# Patient Record
Sex: Female | Born: 1935 | Race: White | Hispanic: No | Smoking: Never smoker
Health system: Southern US, Community
[De-identification: ages and names within clinical notes are randomized; demographics above are authoritative.]

## PROBLEM LIST (undated history)

## (undated) DIAGNOSIS — T4145XA Adverse effect of unspecified anesthetic, initial encounter: Secondary | ICD-10-CM

## (undated) DIAGNOSIS — C50912 Malignant neoplasm of unspecified site of left female breast: Secondary | ICD-10-CM

## (undated) DIAGNOSIS — E46 Unspecified protein-calorie malnutrition: Secondary | ICD-10-CM

## (undated) DIAGNOSIS — R112 Nausea with vomiting, unspecified: Secondary | ICD-10-CM

## (undated) DIAGNOSIS — Z8719 Personal history of other diseases of the digestive system: Secondary | ICD-10-CM

## (undated) DIAGNOSIS — W19XXXA Unspecified fall, initial encounter: Secondary | ICD-10-CM

## (undated) DIAGNOSIS — T8859XA Other complications of anesthesia, initial encounter: Secondary | ICD-10-CM

## (undated) DIAGNOSIS — M1990 Unspecified osteoarthritis, unspecified site: Secondary | ICD-10-CM

## (undated) DIAGNOSIS — Z9889 Other specified postprocedural states: Secondary | ICD-10-CM

## (undated) HISTORY — PX: TONSILLECTOMY: SUR1361

## (undated) HISTORY — PX: ABDOMINAL HYSTERECTOMY: SHX81

## (undated) HISTORY — PX: APPENDECTOMY: SHX54

## (undated) HISTORY — PX: EYE SURGERY: SHX253

## (undated) HISTORY — PX: FRACTURE SURGERY: SHX138

## (undated) HISTORY — PX: CHOLECYSTECTOMY: SHX55

---

## 2000-09-01 ENCOUNTER — Inpatient Hospital Stay (HOSPITAL_COMMUNITY): Admission: EM | Admit: 2000-09-01 | Discharge: 2000-09-02 | Payer: Self-pay | Admitting: Emergency Medicine

## 2000-09-01 ENCOUNTER — Encounter: Payer: Self-pay | Admitting: Orthopedic Surgery

## 2000-09-01 ENCOUNTER — Encounter: Payer: Self-pay | Admitting: Emergency Medicine

## 2001-10-12 ENCOUNTER — Ambulatory Visit (HOSPITAL_COMMUNITY): Admission: RE | Admit: 2001-10-12 | Discharge: 2001-10-12 | Payer: Self-pay | Admitting: *Deleted

## 2004-11-08 ENCOUNTER — Emergency Department (HOSPITAL_COMMUNITY): Admission: EM | Admit: 2004-11-08 | Discharge: 2004-11-08 | Payer: Self-pay | Admitting: Emergency Medicine

## 2009-03-14 ENCOUNTER — Emergency Department (HOSPITAL_COMMUNITY): Admission: EM | Admit: 2009-03-14 | Discharge: 2009-03-14 | Payer: Self-pay | Admitting: Emergency Medicine

## 2010-01-27 ENCOUNTER — Encounter: Admission: RE | Admit: 2010-01-27 | Discharge: 2010-01-27 | Payer: Self-pay | Admitting: Internal Medicine

## 2010-11-13 NOTE — Op Note (Signed)
[facility]  Patient:    Brooke Perkins, Brooke Perkins                       MRN: [id number] Proc. Date: 09/01/00 Adm. Date:  [id number] Disc. Date: [id number] Attending:  Dominica Severin                           Operative Report  DATE OF BIRTH:  05-Nov-1916  PREOPERATIVE DIAGNOSIS:  Comminuted complex displaced closed right distal radius fracture with intra-articular extension.  POSTOPERATIVE DIAGNOSIS:  Comminuted complex displaced closed right distal radius fracture with intra-articular extension.  OPERATION: 1. Open reduction and internal fixation with allograft bone grafting, right    distal radius fracture utilizing K wires for fixations. 2. Application of an external fixator to the right wrist.  SURGEON:  Aron Baba, M.D.  ASSISTANT:  Della Goo, P.A.  COMPLICATIONS:  None.  ANESTHESIA:  General.  ESTIMATED BLOOD LOSS:  Minimal.  DRAINS:  One small TLS.  TOURNIQUET TIME:  Less than 1 hour.  INDICATIONS FOR THE PROCEDURE: This patient is an 77 year old white female who fell today and sustained the above-mentioned fracture.  She is highly independent, lives alone, and is very functional.  She has right-hand dominant posture to her upper extremity.  The patient notes significant pain and has been evaluated very carefully in the emergency room setting.  Given her age, comminution, activity level, hand dominance, and other issues, I felt she would be best served by stabilization.  She has a very displaced comminuted complex radius fracture.  All risks and benefits were discussed with her including risk of infection, bleeding, anesthesia, damage to normal structures, and failure of surgery to accomplish intended goals of relieving symptoms and restoring function.  OPERATIVE FINDINGS:  The patient had a comminuted complex distal radius fracture with some intra-articular extension. The patient had a large amount of osteopenia and  dorsal V defect.  The patient underwent fixation and bone grafting without difficulty.  I was very pleased with her stability as gauged under fluoroscopy and to my palpation and stress test and exam after fixation.  OPERATION IN DETAIL:  The patient was seen by myself in anesthesia preoperatively.  Risks and benefits were discussed.  She was then taken to the operating suite, placed supine.  General endotracheal anesthesia was induced, and she was then appropriately padded.  The right upper extremity was isolated and prepped and draped in usual sterile fashion with Betadine scrub followed by Betadine paint and draping.  Once this was done, the patient had the arm placed in traction.  I performed a manipulative reduction.  Following this, two K wires were placed without difficulty.  This achieved stabilization. Finger traps were hooked up, and the patient then underwent placement of an external fixator.  An incision was made about the distal third, middle third of the forearm.  Dissection was carried through skin with knife blade. Full-thickness flaps were made to ensure no loss of skin.  Care was taken to avoid superficial radial nerve interval between ECRL and ECRB was made, and the patient then had placement of two half pins, checked under radiograph. These were predrilled and then placed.  Once this was done, the area was irrigated and closed with interrupted Prolene suture.  Following this, two drains were placed distally through two small 1 cm incision.  This was made at the dorsoradial aspect of the second metacarpal.  The patient had guide for the half pins used to provide excellent splay.  Dissection was carried down through this small, 1 cm incisions with blunt dissection after knife blade over the skin.  Care was taken to avoid superficial nerve branches. Bicortical screw purchase was obtained with the half pins, and this was checked under fluoroscopy.  Once this was done, the  wounds were irrigated and closed with Prolene suture.  Following this, the patient had the external fixator apparatus assembled.  Once this was done, the patient had the external fixator tightened down with the wrist in some extension to maximize finger flexion postoperatively.  I was very happy with the overall alignment and length.  Care was taken to not overdistract the radial carpal joint.  This was noted on x-ray.  The K wires were in place.  Once this was done, the patient had access to the dorsal V defect.  This was done through the interval between the second and third dorsal compartments. Dissection was carried out through the skin longitudinally with knife. Following this, retractor was placed gently, and the patient had access to the fracture site dorsally obtained.  Once this was done, adjustments were made with the K wire for reduction purposes, and the patient had ORIF accomplished without difficulty.  I was happy with the radiographic parameters and overall look.  The patient had good stability about the distal radial ulnar joint at this time.  The patient then underwent packing of the dorsal V defect with large amounts of cancellous bone graft.  This was allograft bone.  This was discussed with the patient preoperatively.  She tolerated this well.  Once this was done, the allograft bone was tightly packed into place.  Final x-rays were made.  The half pins proximally and distally were in excellent position. The wrist was in excellent position.  The radial height, inclination, and volar tilt were restored to my satisfaction.  The dorsal V defect was filled with cancellous bone graft, and all looked well.  The distal radial ulnar joint was stable with smooth range of motion through pronation and supination.  Once this was done, tourniquet was deflated, hemostasis obtained.  Excellent refill was noted, and total tourniquet time was less than 40 minutes.  The patient then  underwent placement of Xeroform gauze over the wounds after cleansing the skin.  The K wires were clipped below the skin surface just off the radial styloid for later removal.  The  patient tolerated the procedure well without difficulty.  She had a sterile compressive dressing followed by volar plastic splint applied without difficulty after large amounts of gauze were placed.  A small #7 TLS drain was placed in the dorsal incision for the bone graft access. This will be removed tomorrow.  The patient tolerated the orthopedic portion of the procedure without difficulty.  All sponge, needle, and instrument counts were reported as correct.  There were no immediate operative complications.  The patient will be monitored closely postoperatively.  All questions have been encouraged and answered. DD:  09/01/00 TD:  09/02/00 Job: [id number]

## 2010-11-13 NOTE — Procedures (Signed)
[facility]  Patient:    Brooke Perkins, Brooke Perkins Visit Number: [id number] MRN: [id number]          Service Type: END Location: ENDO Attending Physician:  Sabino Gasser Dictated by:   Sabino Gasser, M.D. Proc. Date: 10/12/01 Admit Date:  10/12/2001                             Procedure Report  PROCEDURE:  Colonoscopy.  INDICATIONS FOR PROCEDURE:  Colon cancer screening.  ANESTHESIA:  Demerol 30, Versed 4 mg.  DESCRIPTION OF PROCEDURE:  With the patient mildly sedated in the left lateral decubitus position, the Olympus videoscopic pediatric colonoscope was inserted in the rectum, passed under direct vision through a very tortuous diverticular filled sigmoid colon to the cecum identified by the ileocecal valve and appendiceal orifice both of which were photographed. From this point, the colonoscope was slowly withdrawn taking circumferential views of the entire colonic mucosa stopping only to photograph the diverticula seen in the sigmoid colon until we reached the rectum which appeared normal in direct view and showed large hemorrhoids on retroflex and pull through the anal canal views. The endoscope was withdrawn. The patients vital signs and pulse oximeter remained stable. The patient tolerated the procedure well without apparent complications.  FINDINGS:  Significant diverticulosis of sigmoid colon with tortuosity and hemorrhoids otherwise an unremarkable examination.  PLAN:  Repeat examination as needed. Dictated by:   Sabino Gasser, M.D. Attending Physician:  Sabino Gasser DD:  10/12/01 TD:  10/12/01 Job: [phone]

## 2012-12-17 ENCOUNTER — Emergency Department (HOSPITAL_COMMUNITY): Payer: Medicare Other

## 2012-12-17 ENCOUNTER — Emergency Department (HOSPITAL_COMMUNITY)
Admission: EM | Admit: 2012-12-17 | Discharge: 2012-12-17 | Disposition: A | Discharge status: Home / Self Care | Payer: Medicare Other | Attending: Emergency Medicine | Admitting: Emergency Medicine

## 2012-12-17 ENCOUNTER — Encounter (HOSPITAL_COMMUNITY): Payer: Self-pay

## 2012-12-17 DIAGNOSIS — M129 Arthropathy, unspecified: Secondary | ICD-10-CM | POA: Insufficient documentation

## 2012-12-17 DIAGNOSIS — Z79899 Other long term (current) drug therapy: Secondary | ICD-10-CM | POA: Insufficient documentation

## 2012-12-17 DIAGNOSIS — R0602 Shortness of breath: Secondary | ICD-10-CM

## 2012-12-17 HISTORY — DX: Unspecified osteoarthritis, unspecified site: M19.90

## 2012-12-17 LAB — URINALYSIS, ROUTINE W REFLEX MICROSCOPIC
Glucose, UA: NEGATIVE mg/dL
Hgb urine dipstick: NEGATIVE
Protein, ur: NEGATIVE mg/dL
pH: 7 (ref 5.0–8.0)

## 2012-12-17 LAB — CBC
HCT: 41.2 % (ref 36.0–46.0)
MCH: 32.2 pg (ref 26.0–34.0)
MCV: 99.8 fL (ref 78.0–100.0)
Platelets: 137 10*3/uL — ABNORMAL LOW (ref 150–400)
RBC: 4.13 MIL/uL (ref 3.87–5.11)
RDW: 13.7 % (ref 11.5–15.5)
WBC: 4.8 10*3/uL (ref 4.0–10.5)

## 2012-12-17 LAB — BASIC METABOLIC PANEL
BUN: 14 mg/dL (ref 6–23)
CO2: 30 mEq/L (ref 19–32)
Calcium: 9.8 mg/dL (ref 8.4–10.5)
Chloride: 103 mEq/L (ref 96–112)
Creatinine, Ser: 0.53 mg/dL (ref 0.50–1.10)

## 2012-12-17 LAB — URINE MICROSCOPIC-ADD ON

## 2012-12-17 LAB — D-DIMER, QUANTITATIVE: D-Dimer, Quant: 0.64 ug/mL-FEU — ABNORMAL HIGH (ref 0.00–0.48)

## 2012-12-17 MED ORDER — IOHEXOL 350 MG/ML SOLN
100.0000 mL | Freq: Once | INTRAVENOUS | Status: AC | PRN
  Administered 2012-12-17: 100 mL via INTRAVENOUS

## 2012-12-17 MED ORDER — ALBUTEROL SULFATE HFA 108 (90 BASE) MCG/ACT IN AERS
2.0000 | INHALATION_SPRAY | RESPIRATORY_TRACT | Status: DC | PRN
  Administered 2012-12-17: 2 via RESPIRATORY_TRACT
  Filled 2012-12-17: qty 6.7

## 2012-12-17 NOTE — ED Provider Notes (Signed)
History     CSN: [id number]  Arrival date & time 12/17/12  1709   First MD Initiated Contact with Patient 12/17/12 1818      Chief Complaint  Patient presents with  . Shortness of Breath    (Consider location/radiation/quality/duration/timing/severity/associated sxs/prior treatment) HPI Pt presents with c/o shortness of breath.  Pt states she had onset of shortness of breath earlier this afternoon.  She states it has resolved now.  No chest pain.  No leg swelling.  No fever or cough.  She had family over to have lunch with her today and told them she was starting to feel short of breath, but family at bedside did not notice her to appear short of breath.  No sig medical hx other than arthritis.  Pt currently has no complaints.  There are no other associated systemic symptoms, there are no other alleviating or modifying factors.   Past Medical History  Diagnosis Date  . Arthritis     Past Surgical History  Procedure Laterality Date  . Abdominal hysterectomy    . Cholecystectomy    . Tonsillectomy    . Appendectomy      History reviewed. No pertinent family history.  History  Substance Use Topics  . Smoking status: Never Smoker   . Smokeless tobacco: Never Used  . Alcohol Use: No    OB History   Grav Para Term Preterm Abortions TAB SAB Ect Mult Living                  Review of Systems ROS reviewed and all otherwise negative except for mentioned in HPI  Allergies  Review of patient's allergies indicates no known allergies.  Home Medications   Current Outpatient Rx  Name  Route  Sig  Dispense  Refill  . cholecalciferol (VITAMIN D) 1000 UNITS tablet   Oral   Take 1,000 Units by mouth daily.         Marland Kitchen glucosamine-chondroitin 500-400 MG tablet   Oral   Take 1 tablet by mouth 3 (three) times daily.         Marland Kitchen HYDROcodone-acetaminophen (NORCO/VICODIN) 5-325 MG per tablet   Oral   Take 1 tablet by mouth every 6 (six) hours as needed for pain.         .  Multiple Vitamins-Minerals (MULTIVITAMIN WITH MINERALS) tablet   Oral   Take 1 tablet by mouth daily.         . vitamin B-12 (CYANOCOBALAMIN) 100 MCG tablet   Oral   Take 100 mcg by mouth daily.         . vitamin C (ASCORBIC ACID) 500 MG tablet   Oral   Take 500 mg by mouth daily.           BP 170/77  Pulse 84  Temp(Src) 98.6 F (37 C) (Oral)  Resp 17  Ht 5\' 4"  (1.626 m)  Wt 110 lb (49.896 kg)  BMI 18.87 kg/m2  SpO2 96% Vitals reviewed Physical Exam Physical Examination: General appearance - alert, well appearing, and in no distress Mental status - alert, oriented to person, place, and time Eyes - pupils equal and reactive, no conjunctival injection, no scleral icterus Mouth - mucous membranes moist, pharynx normal without lesions Chest - clear to auscultation, no wheezes, rales or rhonchi, symmetric air entry, no increased respiratory effort, patient speaking in full sentences  Heart - normal rate, regular rhythm, normal S1, S2, no murmurs, rubs, clicks or gallops Abdomen - soft, nontender, nondistended,  no masses or organomegaly Extremities - peripheral pulses normal, no pedal edema, no clubbing or cyanosis Skin - normal coloration and turgor, no rashes   ED Course  Procedures (including critical care time)   Date: 12/17/2012  Rate: 80  Rhythm: normal sinus rhythm  QRS Axis: normal  Intervals: normal  ST/T Wave abnormalities: nonspecific ST/T changes  Conduction Disutrbances:none  Narrative Interpretation:   Old EKG Reviewed: none available   Labs Reviewed  CBC - Abnormal; Notable for the following:    Platelets 137 (*)    All other components within normal limits  BASIC METABOLIC PANEL - Abnormal; Notable for the following:    Potassium 3.4 (*)    Glucose, Bld 102 (*)    GFR calc non Af Amer 79 (*)    All other components within normal limits  D-DIMER, QUANTITATIVE - Abnormal; Notable for the following:    D-Dimer, Quant 0.64 (*)    All other  components within normal limits  URINALYSIS, ROUTINE W REFLEX MICROSCOPIC - Abnormal; Notable for the following:    Leukocytes, UA MODERATE (*)    All other components within normal limits  PRO B NATRIURETIC PEPTIDE  TROPONIN I  URINE MICROSCOPIC-ADD ON   Dg Chest 2 View  12/17/2012   *RADIOLOGY REPORT*  Clinical Data: Short of breath and weakness  CHEST - 2 VIEW  Comparison: None  Findings: COPD with hyperinflation.  Negative for pneumonia. Negative for heart failure.  There is a tiny left effusion.  The aorta is uncoiled.  IMPRESSION: Tiny left effusion and COPD.  Negative for heart failure or pneumonia.   Original Report Authenticated By: Janeece Riggers, M.D.   Ct Angio Chest Pe W/cm &/or Wo Cm  12/17/2012   *RADIOLOGY REPORT*  Clinical Data: Shortness of breath, elevated D-dimer.  CT ANGIOGRAPHY CHEST  Technique:  Multidetector CT imaging of the chest using the standard protocol during bolus administration of intravenous contrast. Multiplanar reconstructed images including MIPs were obtained and reviewed to evaluate the vascular anatomy.  Contrast: OMNIPAQUE IOHEXOL 350 MG/ML SOLN  Comparison: 12/17/2012  Findings: Hyperinflation of the lungs compatible with COPD. Cardiomegaly.  Aorta is tortuous and ectatic, non-aneurysmal.  No filling defects in the pulmonary arteries to suggest pulmonary emboli.  Minimal scarring or atelectasis in the lung bases.  No pleural effusions.  No acute bony abnormality.  Imaging into the upper abdomen demonstrates intrahepatic biliary ductal dilatation.  The extrahepatic biliary system is not visualized on this chest CT.  IMPRESSION: No evidence of pulmonary embolus.  Hyperinflation/COPD.  Cardiomegaly.   Original Report Authenticated By: Charlett Nose, M.D.     1. Shortness of breath       MDM  Pt presenting with c/o shortness of breath which has now resolved.  Labs are reassuring except d-dimer being elevated, CT angio of chest was reassuring- does show  picture c/w COPD.  Pt has always been a nonsmoker- no hx of COPD- no wheezing on exam.  Given albuterol MDI.  Pt is asymptomatic at this time- low suspicion for CHF, ACS, PE, or any other acute emergent problem at this time.  She and family feel comfortable with plan for discharge.           Ethelda Chick, MD 12/19/12 [phone]

## 2012-12-17 NOTE — ED Notes (Signed)
Patient reports that she began having SOB about 3 hours. Patient denies CP.

## 2015-06-25 DIAGNOSIS — R5383 Other fatigue: Secondary | ICD-10-CM | POA: Diagnosis not present

## 2015-06-25 DIAGNOSIS — E559 Vitamin D deficiency, unspecified: Secondary | ICD-10-CM | POA: Diagnosis not present

## 2015-06-25 DIAGNOSIS — Z0000 Encounter for general adult medical examination without abnormal findings: Secondary | ICD-10-CM | POA: Diagnosis not present

## 2015-06-25 DIAGNOSIS — Z0001 Encounter for general adult medical examination with abnormal findings: Secondary | ICD-10-CM | POA: Diagnosis not present

## 2015-06-25 DIAGNOSIS — Z23 Encounter for immunization: Secondary | ICD-10-CM | POA: Diagnosis not present

## 2015-07-01 DIAGNOSIS — E559 Vitamin D deficiency, unspecified: Secondary | ICD-10-CM | POA: Diagnosis not present

## 2015-07-01 DIAGNOSIS — Z0000 Encounter for general adult medical examination without abnormal findings: Secondary | ICD-10-CM | POA: Diagnosis not present

## 2015-11-16 ENCOUNTER — Observation Stay (HOSPITAL_COMMUNITY)
Admission: EM | Admit: 2015-11-16 | Discharge: 2015-11-18 | Disposition: A | Discharge status: Skilled Nursing Facility | Payer: Medicare Other | Attending: Internal Medicine | Admitting: Internal Medicine

## 2015-11-16 ENCOUNTER — Emergency Department (HOSPITAL_COMMUNITY): Payer: Medicare Other

## 2015-11-16 ENCOUNTER — Encounter (HOSPITAL_COMMUNITY): Payer: Self-pay | Admitting: Emergency Medicine

## 2015-11-16 DIAGNOSIS — H9190 Unspecified hearing loss, unspecified ear: Secondary | ICD-10-CM | POA: Insufficient documentation

## 2015-11-16 DIAGNOSIS — W19XXXA Unspecified fall, initial encounter: Secondary | ICD-10-CM | POA: Diagnosis not present

## 2015-11-16 DIAGNOSIS — R030 Elevated blood-pressure reading, without diagnosis of hypertension: Secondary | ICD-10-CM | POA: Diagnosis not present

## 2015-11-16 DIAGNOSIS — E440 Moderate protein-calorie malnutrition: Secondary | ICD-10-CM | POA: Diagnosis not present

## 2015-11-16 DIAGNOSIS — Z681 Body mass index (BMI) 19 or less, adult: Secondary | ICD-10-CM | POA: Diagnosis not present

## 2015-11-16 DIAGNOSIS — S8002XA Contusion of left knee, initial encounter: Secondary | ICD-10-CM | POA: Diagnosis not present

## 2015-11-16 DIAGNOSIS — M25562 Pain in left knee: Secondary | ICD-10-CM | POA: Insufficient documentation

## 2015-11-16 DIAGNOSIS — R269 Unspecified abnormalities of gait and mobility: Secondary | ICD-10-CM | POA: Insufficient documentation

## 2015-11-16 DIAGNOSIS — Z9049 Acquired absence of other specified parts of digestive tract: Secondary | ICD-10-CM | POA: Diagnosis not present

## 2015-11-16 DIAGNOSIS — Z79899 Other long term (current) drug therapy: Secondary | ICD-10-CM | POA: Insufficient documentation

## 2015-11-16 DIAGNOSIS — Z90710 Acquired absence of both cervix and uterus: Secondary | ICD-10-CM | POA: Diagnosis not present

## 2015-11-16 DIAGNOSIS — T148 Other injury of unspecified body region: Secondary | ICD-10-CM | POA: Diagnosis not present

## 2015-11-16 DIAGNOSIS — S8001XA Contusion of right knee, initial encounter: Secondary | ICD-10-CM | POA: Diagnosis not present

## 2015-11-16 DIAGNOSIS — M1990 Unspecified osteoarthritis, unspecified site: Secondary | ICD-10-CM | POA: Diagnosis present

## 2015-11-16 DIAGNOSIS — S8991XA Unspecified injury of right lower leg, initial encounter: Secondary | ICD-10-CM | POA: Diagnosis not present

## 2015-11-16 DIAGNOSIS — S32040A Wedge compression fracture of fourth lumbar vertebra, initial encounter for closed fracture: Secondary | ICD-10-CM | POA: Insufficient documentation

## 2015-11-16 DIAGNOSIS — M533 Sacrococcygeal disorders, not elsewhere classified: Secondary | ICD-10-CM | POA: Insufficient documentation

## 2015-11-16 DIAGNOSIS — M25561 Pain in right knee: Secondary | ICD-10-CM | POA: Diagnosis not present

## 2015-11-16 DIAGNOSIS — N6320 Unspecified lump in the left breast, unspecified quadrant: Secondary | ICD-10-CM

## 2015-11-16 DIAGNOSIS — R262 Difficulty in walking, not elsewhere classified: Secondary | ICD-10-CM

## 2015-11-16 DIAGNOSIS — M7989 Other specified soft tissue disorders: Secondary | ICD-10-CM | POA: Diagnosis not present

## 2015-11-16 DIAGNOSIS — N63 Unspecified lump in breast: Secondary | ICD-10-CM | POA: Diagnosis not present

## 2015-11-16 DIAGNOSIS — M5136 Other intervertebral disc degeneration, lumbar region: Secondary | ICD-10-CM | POA: Diagnosis not present

## 2015-11-16 DIAGNOSIS — S3993XA Unspecified injury of pelvis, initial encounter: Secondary | ICD-10-CM | POA: Diagnosis not present

## 2015-11-16 DIAGNOSIS — N390 Urinary tract infection, site not specified: Principal | ICD-10-CM | POA: Insufficient documentation

## 2015-11-16 DIAGNOSIS — E876 Hypokalemia: Secondary | ICD-10-CM | POA: Diagnosis present

## 2015-11-16 DIAGNOSIS — IMO0001 Reserved for inherently not codable concepts without codable children: Secondary | ICD-10-CM | POA: Diagnosis present

## 2015-11-16 LAB — URINALYSIS, ROUTINE W REFLEX MICROSCOPIC
Glucose, UA: 100 mg/dL — AB
Hgb urine dipstick: NEGATIVE
Ketones, ur: NEGATIVE mg/dL
NITRITE: NEGATIVE
PROTEIN: 100 mg/dL — AB
SPECIFIC GRAVITY, URINE: 1.022 (ref 1.005–1.030)
pH: 6 (ref 5.0–8.0)

## 2015-11-16 LAB — COMPREHENSIVE METABOLIC PANEL
ALK PHOS: 52 U/L (ref 38–126)
ALT: 26 U/L (ref 14–54)
AST: 44 U/L — ABNORMAL HIGH (ref 15–41)
Albumin: 3.8 g/dL (ref 3.5–5.0)
Anion gap: 8 (ref 5–15)
BUN: 32 mg/dL — ABNORMAL HIGH (ref 6–20)
CALCIUM: 9.3 mg/dL (ref 8.9–10.3)
CO2: 29 mmol/L (ref 22–32)
CREATININE: 0.74 mg/dL (ref 0.44–1.00)
Chloride: 105 mmol/L (ref 101–111)
Glucose, Bld: 104 mg/dL — ABNORMAL HIGH (ref 65–99)
Potassium: 3.2 mmol/L — ABNORMAL LOW (ref 3.5–5.1)
Sodium: 142 mmol/L (ref 135–145)
TOTAL PROTEIN: 6.5 g/dL (ref 6.5–8.1)
Total Bilirubin: 1.2 mg/dL (ref 0.3–1.2)

## 2015-11-16 LAB — CBC WITH DIFFERENTIAL/PLATELET
BASOS PCT: 0 %
Basophils Absolute: 0 10*3/uL (ref 0.0–0.1)
EOS ABS: 0 10*3/uL (ref 0.0–0.7)
Eosinophils Relative: 0 %
HCT: 42.5 % (ref 36.0–46.0)
HEMOGLOBIN: 13.8 g/dL (ref 12.0–15.0)
Lymphocytes Relative: 8 %
Lymphs Abs: 0.7 10*3/uL (ref 0.7–4.0)
MCH: 32.5 pg (ref 26.0–34.0)
MCHC: 32.5 g/dL (ref 30.0–36.0)
MCV: 100.2 fL — ABNORMAL HIGH (ref 78.0–100.0)
Monocytes Absolute: 0.5 10*3/uL (ref 0.1–1.0)
Monocytes Relative: 6 %
NEUTROS PCT: 86 %
Neutro Abs: 7.9 10*3/uL — ABNORMAL HIGH (ref 1.7–7.7)
Platelets: 125 10*3/uL — ABNORMAL LOW (ref 150–400)
RBC: 4.24 MIL/uL (ref 3.87–5.11)
RDW: 14.3 % (ref 11.5–15.5)
WBC: 9.2 10*3/uL (ref 4.0–10.5)

## 2015-11-16 LAB — URINE MICROSCOPIC-ADD ON

## 2015-11-16 LAB — CK: CK TOTAL: 551 U/L — AB (ref 38–234)

## 2015-11-16 MED ORDER — TRAMADOL HCL 50 MG PO TABS
50.0000 mg | ORAL_TABLET | Freq: Once | ORAL | Status: AC
  Administered 2015-11-16: 50 mg via ORAL
  Filled 2015-11-16: qty 1

## 2015-11-16 MED ORDER — VITAMIN B-12 100 MCG PO TABS
100.0000 ug | ORAL_TABLET | Freq: Every day | ORAL | Status: DC
  Administered 2015-11-17 – 2015-11-18 (×3): 100 ug via ORAL
  Filled 2015-11-16 (×3): qty 1

## 2015-11-16 MED ORDER — MORPHINE SULFATE (PF) 2 MG/ML IV SOLN: 1.0000 mg | INTRAVENOUS | Status: DC | PRN

## 2015-11-16 MED ORDER — HYDRALAZINE HCL 20 MG/ML IJ SOLN: 2.0000 mg | INTRAMUSCULAR | Status: DC | PRN

## 2015-11-16 MED ORDER — ENSURE ENLIVE PO LIQD
237.0000 mL | Freq: Two times a day (BID) | ORAL | Status: DC
  Administered 2015-11-17 – 2015-11-18 (×3): 237 mL via ORAL

## 2015-11-16 MED ORDER — MORPHINE SULFATE (PF) 4 MG/ML IV SOLN
4.0000 mg | Freq: Once | INTRAVENOUS | Status: AC
  Administered 2015-11-16: 4 mg via INTRAVENOUS
  Filled 2015-11-16: qty 1

## 2015-11-16 MED ORDER — DEXTROSE 5 % IV SOLN
1.0000 g | INTRAVENOUS | Status: DC
  Administered 2015-11-16 – 2015-11-17 (×2): 1 g via INTRAVENOUS
  Filled 2015-11-16 (×2): qty 10

## 2015-11-16 MED ORDER — VITAMIN D 1000 UNITS PO TABS
1000.0000 [IU] | ORAL_TABLET | Freq: Every day | ORAL | Status: DC
  Administered 2015-11-17 – 2015-11-18 (×3): 1000 [IU] via ORAL
  Filled 2015-11-16 (×3): qty 1

## 2015-11-16 MED ORDER — ENOXAPARIN SODIUM 40 MG/0.4ML ~~LOC~~ SOLN
40.0000 mg | SUBCUTANEOUS | Status: DC
  Administered 2015-11-17: 40 mg via SUBCUTANEOUS
  Filled 2015-11-16: qty 0.4

## 2015-11-16 MED ORDER — ONDANSETRON HCL 4 MG/2ML IJ SOLN: 4.0000 mg | Freq: Three times a day (TID) | INTRAMUSCULAR | Status: DC | PRN

## 2015-11-16 MED ORDER — GLUCOSAMINE-CHONDROITIN 500-400 MG PO TABS: 1.0000 | ORAL_TABLET | Freq: Three times a day (TID) | ORAL | Status: DC

## 2015-11-16 MED ORDER — POTASSIUM CHLORIDE 20 MEQ/15ML (10%) PO SOLN
40.0000 meq | Freq: Once | ORAL | Status: AC
  Administered 2015-11-17: 40 meq via ORAL
  Filled 2015-11-16: qty 30

## 2015-11-16 MED ORDER — SODIUM CHLORIDE 0.9 % IV SOLN
Freq: Once | INTRAVENOUS | Status: AC
  Administered 2015-11-17: via INTRAVENOUS

## 2015-11-16 MED ORDER — ACETAMINOPHEN 325 MG PO TABS: 650.0000 mg | ORAL_TABLET | Freq: Three times a day (TID) | ORAL | Status: DC | PRN

## 2015-11-16 MED ORDER — SODIUM CHLORIDE 0.9 % IV SOLN
Freq: Once | INTRAVENOUS | Status: AC
  Administered 2015-11-16: 21:00:00 via INTRAVENOUS

## 2015-11-16 MED ORDER — VITAMIN C 500 MG PO TABS
500.0000 mg | ORAL_TABLET | Freq: Every day | ORAL | Status: DC
Start: 2015-11-16 — End: 2015-11-18
  Administered 2015-11-17 (×2): 500 mg via ORAL
  Filled 2015-11-16 (×3): qty 1

## 2015-11-16 MED ORDER — ONDANSETRON HCL 4 MG/2ML IJ SOLN
4.0000 mg | Freq: Once | INTRAMUSCULAR | Status: AC
  Administered 2015-11-16: 4 mg via INTRAVENOUS
  Filled 2015-11-16: qty 2

## 2015-11-16 MED ORDER — SODIUM CHLORIDE 0.9 % IV SOLN: Freq: Once | INTRAVENOUS | Status: DC

## 2015-11-16 MED ORDER — OXYCODONE-ACETAMINOPHEN 5-325 MG PO TABS
1.0000 | ORAL_TABLET | ORAL | Status: DC | PRN
  Administered 2015-11-17 – 2015-11-18 (×6): 1 via ORAL
  Filled 2015-11-16 (×6): qty 1

## 2015-11-16 NOTE — ED Provider Notes (Signed)
CSN: [id number]     Arrival date & time 11/16/15  1610 History   First MD Initiated Contact with Patient 11/16/15 1613     Chief Complaint  Patient presents with  . Fall   PT FELL LAST NIGHT AROUND 2300 AS SHE WAS TRYING TO OPEN A WINDOW.  THE PT WAS UNABLE TO GET UP OFF THE FLOOR LAST NIGHT AND WAS STUCK ON THE GROUND.  THE PT WAS ABLE TO GET TO HER PHONE THIS AM AND CALLED HER SON.  HER SON HELPED HER UP, BUT PT WAS UNABLE TO WALK.  PT C/O LOW BACK AND TAILBONE PAIN AND LEFT KNEE PAIN.  PT DID TAKE HER TRAMADOL THIS AM AND THAT HAS HELPED THE PAIN.  (Consider location/radiation/quality/duration/timing/severity/associated sxs/prior Treatment) Patient is a 82 y.o. female presenting with fall. The history is provided by the patient and the EMS personnel.  Fall This is a new problem. The current episode started 12 to 24 hours ago. The symptoms are aggravated by walking. The symptoms are relieved by lying down.    Past Medical History  Diagnosis Date  . Arthritis    Past Surgical History  Procedure Laterality Date  . Abdominal hysterectomy    . Cholecystectomy    . Tonsillectomy    . Appendectomy     No family history on file. Social History  Substance Use Topics  . Smoking status: Never Smoker   . Smokeless tobacco: Never Used  . Alcohol Use: No   OB History    No data available     Review of Systems  Musculoskeletal: Positive for back pain and gait problem.       LEFT KNEE PAIN  All other systems reviewed and are negative.     Allergies  Review of patient's allergies indicates no known allergies.  Home Medications   Prior to Admission medications   Medication Sig Start Date End Date Taking? Authorizing Provider  acetaminophen (TYLENOL) 650 MG CR tablet Take 650 mg by mouth every 8 (eight) hours as needed for pain.   Yes Historical Provider, MD  cholecalciferol (VITAMIN D) 1000 UNITS tablet Take 1,000 Units by mouth daily.   Yes Historical Provider, MD   glucosamine-chondroitin 500-400 MG tablet Take 1 tablet by mouth 3 (three) times daily.   Yes Historical Provider, MD  traMADol (ULTRAM) 50 MG tablet Take 50 mg by mouth 2 (two) times daily as needed for moderate pain or severe pain.  10/16/15  Yes Historical Provider, MD  vitamin B-12 (CYANOCOBALAMIN) 100 MCG tablet Take 100 mcg by mouth daily.   Yes Historical Provider, MD  vitamin C (ASCORBIC ACID) 500 MG tablet Take 500 mg by mouth daily.   Yes Historical Provider, MD   BP 186/89 mmHg  Pulse 88  Temp(Src) 97.6 F (36.4 C) (Oral)  Resp 16  Ht 5\' 4"  (1.626 m)  Wt 103 lb (46.72 kg)  BMI 17.67 kg/m2  SpO2 92% Physical Exam  Constitutional: She is oriented to person, place, and time. She appears well-developed and well-nourished.  HENT:  Head: Normocephalic and atraumatic.  Right Ear: External ear normal.  Left Ear: External ear normal.  Nose: Nose normal.  Mouth/Throat: Oropharynx is clear and moist.  Eyes: Conjunctivae and EOM are normal. Pupils are equal, round, and reactive to light.  Neck: Normal range of motion. Neck supple.  Cardiovascular: Normal rate, regular rhythm, normal heart sounds and intact distal pulses.   Pulmonary/Chest: Effort normal and breath sounds normal.  Abdominal: Soft. Bowel sounds  are normal.  Musculoskeletal:       Left knee: She exhibits swelling. Tenderness found.       Lumbar back: She exhibits tenderness.  Neurological: She is alert and oriented to person, place, and time.  Skin: Skin is warm and dry.  Psychiatric: She has a normal mood and affect. Her behavior is normal. Judgment and thought content normal.  Nursing note and vitals reviewed.   ED Course  Procedures (including critical care time) Labs Review Labs Reviewed  COMPREHENSIVE METABOLIC PANEL - Abnormal; Notable for the following:    Potassium 3.2 (*)    Glucose, Bld 104 (*)    BUN 32 (*)    AST 44 (*)    All other components within normal limits  CBC WITH DIFFERENTIAL/PLATELET  - Abnormal; Notable for the following:    MCV 100.2 (*)    Platelets 125 (*)    Neutro Abs 7.9 (*)    All other components within normal limits  URINALYSIS, ROUTINE W REFLEX MICROSCOPIC (NOT AT [facility]) - Abnormal; Notable for the following:    Color, Urine AMBER (*)    APPearance CLOUDY (*)    Glucose, UA 100 (*)    Bilirubin Urine SMALL (*)    Protein, ur 100 (*)    Leukocytes, UA MODERATE (*)    All other components within normal limits  CK - Abnormal; Notable for the following:    Total CK 551 (*)    All other components within normal limits  URINE MICROSCOPIC-ADD ON - Abnormal; Notable for the following:    Squamous Epithelial / LPF 0-5 (*)    Bacteria, UA FEW (*)    Casts HYALINE CASTS (*)    All other components within normal limits  MYOGLOBIN, SERUM    Imaging Review Dg Lumbar Spine Complete  11/16/2015  CLINICAL DATA:  82 year old female with lumbosacral back pain. EXAM: LUMBAR SPINE - COMPLETE 4+ VIEW COMPARISON:  Radiographs 01/20/2010 FINDINGS: Increased dextroscoliotic curvature of the lumbar spine. Progressive disc space narrowing and endplate spurring throughout most prominent at L1-L2. Progressive facet arthropathy at L4-L5 and L5-S1. Questionable superior endplate compression deformity of L4 versus positioning. Vertebral body heights are otherwise maintained. The bones are under mineralized. IMPRESSION: 1. Questionable mild superior endplate compression deformity of L4, age-indeterminate, versus positioning. 2. Progressive scoliosis, degenerative disc disease, and facet arthropathy since exam 6 years prior. Electronically Signed   By: Jeb Levering M.D.   On: 11/16/2015 21:12   Dg Pelvis 1-2 Views  11/16/2015  CLINICAL DATA:  Status post fall, with sacral pain. Initial encounter. EXAM: PELVIS - 1-2 VIEW COMPARISON:  Lumbar spine radiograph performed 03/14/2009 FINDINGS: There is no evidence of fracture or dislocation. Both femoral heads are seated normally within their  respective acetabula. Mild degenerative change is noted at the lower lumbar spine. The sacroiliac joints are unremarkable in appearance. The sacrum is not well assessed on this image. The visualized bowel gas pattern is grossly unremarkable in appearance. A moderate amount of stool is seen in the colon. IMPRESSION: No evidence of fracture or dislocation. Electronically Signed   By: Garald Balding M.D.   On: 11/16/2015 18:39   Dg Sacrum/coccyx  11/16/2015  CLINICAL DATA:  Status post fall, with sacral pain. Initial encounter. EXAM: SACRUM AND COCCYX - 2+ VIEW COMPARISON:  Lumbar spine radiographs performed 03/14/2009 FINDINGS: The sacrum and coccyx demonstrate grossly normal alignment, without definite evidence of fracture. Mild degenerative change is noted at the lower lumbar spine. The sacroiliac joints  demonstrate mild sclerosis. The visualized portions of the hips are grossly unremarkable. IMPRESSION: No evidence of fracture. Electronically Signed   By: Garald Balding M.D.   On: 11/16/2015 18:40   Dg Knee Complete 4 Views Left  11/16/2015  CLINICAL DATA:  Status post fall, with bilateral knee pain. Initial encounter. EXAM: LEFT KNEE - COMPLETE 4+ VIEW COMPARISON:  None. FINDINGS: There is no evidence of fracture or dislocation. Mild chondrocalcinosis is noted. There is diffuse osteopenia of visualized osseous structures. The joint spaces are preserved. No significant degenerative change is seen; the patellofemoral joint is grossly unremarkable in appearance. Trace knee joint fluid remains within normal limits. Soft tissue swelling is noted superior to the patella. IMPRESSION: 1. No evidence of fracture or dislocation. 2. Diffuse osteopenia of visualized osseous structures. 3. Mild chondrocalcinosis noted. 4. Soft tissue swelling superior to the patella. Electronically Signed   By: Garald Balding M.D.   On: 11/16/2015 18:38   Dg Knee Complete 4 Views Right  11/16/2015  CLINICAL DATA:  Status post fall,  with bilateral knee pain. Initial encounter. EXAM: RIGHT KNEE - COMPLETE 4+ VIEW COMPARISON:  None. FINDINGS: There is no evidence of fracture or dislocation. Marginal osteophyte formation is noted at the lateral compartment, and tibial spine osteophytes are seen. The joint spaces are preserved. No significant joint effusion is seen. The visualized soft tissues are normal in appearance. IMPRESSION: 1. No evidence of fracture or dislocation. 2. Mild osteoarthritis noted at the right knee. Electronically Signed   By: Garald Balding M.D.   On: 11/16/2015 18:41   I have personally reviewed and evaluated these images and lab results as part of my medical decision-making.   EKG Interpretation None      MDM  PT ATTEMPTED TO AMBULATE, BUT SHE WAS UNABLE TO DO SO.  SHE SAID IT WAS TOO PAINFUL.  THE PT WILL BE D/W HOSPITALISTS FOR ADMISSION.  DR. Blaine Hamper WILL ADMIT PT FOR OBS. Final diagnoses:  Ambulatory dysfunction  Inability to walk  Compression fracture of L4 lumbar vertebra, closed, initial encounter (HCC)        Isla Pence, MD 11/17/15 0001

## 2015-11-16 NOTE — H&P (Signed)
History and Physical    Brooke Perkins [id number] DOB: Mar 20, 1917 DOA: 11/16/2015  Referring MD/NP/PA:   PCP: No primary care provider on file.   Patient coming from:  The patient is coming from home.  At baseline, she is independent for most of her ADL.  She lives alone at home.  Chief Complaint: Pain over sacral area and bilateral knees after fall, burning on urination, left breast mass  HPI: Brooke Perkins is a 82 y.o. female with medical history significant of heard hearing, arthritis, who presents with pain over sacral area and bilateral knees after fall, burning on urination and left breast mass.  Pt reports that she fell when she was trying to open a window at about 11 PM last night. She did not have prodromal symptoms. No LOC or head injury. She developed pain over bilateral knees, and sacral area. She was unable to get up after fall last night and was stuck on the ground. She was able to get up to a phone this morning and called her son. Her son helped her up and found that patient was unable to walk due to severe pain over tailbone and the bilateral knees. Pt has bruise over right elbow, but no significant pain over that area. Patient states that she has mild burning on urination recently, but no dysuria or increased urinary frequency. She does not have chest pain, shortness breath, cough, nausea, vomiting, abdominal pain, diarrhea, unilateral weakness. Pt states that she noted a mass in left breast for several months, did not seek for treatment.  ED Course: pt was found to have positive urinalysis with moderate amount of leukocyte, temperature normal, no tachycardia, no tachypnea, potassium 3.2, creatinine normal, WBC 9.2. X-ray of bilateral knees, pelvis and sacrum/coccyx are negative. X-ray of lumbar spin showed qeuestionable mild superior endplate compression deformity of L4, age-indeterminate, versus positioning, progressive scoliosis, degenerative disc disease, and facet  arthropathy since exam 6 years prior. Pt is placed on med-surg bed for obs.  Review of Systems:   General: no fevers, chills, no changes in body weight, has fatigue HEENT: no blurry vision, hearing changes or sore throat Pulm: no dyspnea, coughing, wheezing CV: no chest pain, no palpitations Abd: no nausea, vomiting, abdominal pain, diarrhea, constipation GU: no dysuria, has burning on urination, no increased urinary frequency, hematuria  Ext: no leg edema Neuro: no unilateral weakness, numbness, or tingling, no vision change or hearing loss Skin: no rash. Has bruise over right elbow MSK: has pain over bilateral knees, sacral area Heme: No easy bruising.  Travel history: No recent long distant travel.  Allergy: No Known Allergies  Past Medical History  Diagnosis Date  . Arthritis     Past Surgical History  Procedure Laterality Date  . Abdominal hysterectomy    . Cholecystectomy    . Tonsillectomy    . Appendectomy      Social History:  reports that she has never smoked. She has never used smokeless tobacco. She reports that she does not drink alcohol or use illicit drugs.  Family History:  Family History  Problem Relation Age of Onset  . Arthritis/Rheumatoid Mother      Prior to Admission medications   Medication Sig Start Date End Date Taking? Authorizing Provider  acetaminophen (TYLENOL) 650 MG CR tablet Take 650 mg by mouth every 8 (eight) hours as needed for pain.   Yes Historical Provider, MD  cholecalciferol (VITAMIN D) 1000 UNITS tablet Take 1,000 Units by mouth daily.   Yes Historical Provider,  MD  glucosamine-chondroitin 500-400 MG tablet Take 1 tablet by mouth 3 (three) times daily.   Yes Historical Provider, MD  traMADol (ULTRAM) 50 MG tablet Take 50 mg by mouth 2 (two) times daily as needed for moderate pain or severe pain.  10/16/15  Yes Historical Provider, MD  vitamin B-12 (CYANOCOBALAMIN) 100 MCG tablet Take 100 mcg by mouth daily.   Yes Historical  Provider, MD  vitamin C (ASCORBIC ACID) 500 MG tablet Take 500 mg by mouth daily.   Yes Historical Provider, MD    Physical Exam: Filed Vitals:   11/16/15 2321 11/16/15 2356 11/17/15 0005 11/17/15 0532  BP: 176/88  148/75 169/80  Pulse:   89 81  Temp:   98.2 F (36.8 C) 98.2 F (36.8 C)  TempSrc:   Oral Oral  Resp: 19  16 18   Height:  5\' 4"  (1.626 m)    Weight:  44.5 kg (98 lb 1.7 oz)    SpO2:   90% 92%   General: Not in acute distress HEENT:       Eyes: PERRL, EOMI, no scleral icterus.       ENT: No discharge from the ears and nose, no pharynx injection, no tonsillar enlargement.        Neck: No JVD, no bruit, no mass felt. Heme: No neck lymph node enlargement. Cardiac: S1/S2, RRR, No murmurs, No gallops or rubs. Pulm: No rales, wheezing, rhonchi or rubs. Breast: pt has large mass in left breast, located in the central of breast with skin erosion. The mass is about 6 x 8 cm in size, hard, nonmoveble. Abd: Soft, nondistended, nontender, no rebound pain, no organomegaly, BS present. GU: No hematuria Ext: No pitting leg edema bilaterally. 2+DP/PT pulse bilaterally. Musculoskeletal: has tenderness over both knees (L>R) without swelling or redness. Has tenderness over sacral area. Skin: No rashes. Bruise over right elbow. Neuro: Alert, oriented X3, cranial nerves II-XII grossly intact, moves all extremities normally. muscle strength 5/5 in all extremities, sensation to light touch intact.  Knee reflex 1+ bilaterally. Negative Babinski's sign. Normal finger to nose test. Psych: Patient is not psychotic, no suicidal or hemocidal ideation.  Labs on Admission: I have personally reviewed following labs and imaging studies  CBC:  Recent Labs Lab 11/16/15 1659  WBC 9.2  NEUTROABS 7.9*  HGB 13.8  HCT 42.5  MCV 100.2*  PLT 0000000*   Basic Metabolic Panel:  Recent Labs Lab 11/16/15 1659 11/17/15 0418  NA 142  --   K 3.2*  --   CL 105  --   CO2 29  --   GLUCOSE 104*  --     BUN 32*  --   CREATININE 0.74  --   CALCIUM 9.3  --   MG  --  1.9   GFR: Estimated Creatinine Clearance: 28.9 mL/min (by C-G formula based on Cr of 0.74). Liver Function Tests:  Recent Labs Lab 11/16/15 1659  AST 44*  ALT 26  ALKPHOS 52  BILITOT 1.2  PROT 6.5  ALBUMIN 3.8   No results for input(s): LIPASE, AMYLASE in the last 168 hours. No results for input(s): AMMONIA in the last 168 hours. Coagulation Profile: No results for input(s): INR, PROTIME in the last 168 hours. Cardiac Enzymes:  Recent Labs Lab 11/16/15 1659  CKTOTAL 551*   BNP (last 3 results) No results for input(s): PROBNP in the last 8760 hours. HbA1C: No results for input(s): HGBA1C in the last 72 hours. CBG: No results for input(s): GLUCAP  in the last 168 hours. Lipid Profile: No results for input(s): CHOL, HDL, LDLCALC, TRIG, CHOLHDL, LDLDIRECT in the last 72 hours. Thyroid Function Tests: No results for input(s): TSH, T4TOTAL, FREET4, T3FREE, THYROIDAB in the last 72 hours. Anemia Panel: No results for input(s): VITAMINB12, FOLATE, FERRITIN, TIBC, IRON, RETICCTPCT in the last 72 hours. Urine analysis:    Component Value Date/Time   COLORURINE AMBER* 11/16/2015 1623   APPEARANCEUR CLOUDY* 11/16/2015 1623   LABSPEC 1.022 11/16/2015 1623   PHURINE 6.0 11/16/2015 1623   GLUCOSEU 100* 11/16/2015 1623   HGBUR NEGATIVE 11/16/2015 1623   BILIRUBINUR SMALL* 11/16/2015 1623   KETONESUR NEGATIVE 11/16/2015 1623   PROTEINUR 100* 11/16/2015 1623   UROBILINOGEN 0.2 12/17/2012 1926   NITRITE NEGATIVE 11/16/2015 1623   LEUKOCYTESUR MODERATE* 11/16/2015 1623   Sepsis Labs: @LABRCNTIP (procalcitonin:4,lacticidven:4) )No results found for this or any previous visit (from the past 240 hour(s)).   Radiological Exams on Admission: Dg Lumbar Spine Complete  11/16/2015  CLINICAL DATA:  82 year old female with lumbosacral back pain. EXAM: LUMBAR SPINE - COMPLETE 4+ VIEW COMPARISON:  Radiographs 01/20/2010  FINDINGS: Increased dextroscoliotic curvature of the lumbar spine. Progressive disc space narrowing and endplate spurring throughout most prominent at L1-L2. Progressive facet arthropathy at L4-L5 and L5-S1. Questionable superior endplate compression deformity of L4 versus positioning. Vertebral body heights are otherwise maintained. The bones are under mineralized. IMPRESSION: 1. Questionable mild superior endplate compression deformity of L4, age-indeterminate, versus positioning. 2. Progressive scoliosis, degenerative disc disease, and facet arthropathy since exam 6 years prior. Electronically Signed   By: Jeb Levering M.D.   On: 11/16/2015 21:12   Dg Pelvis 1-2 Views  11/16/2015  CLINICAL DATA:  Status post fall, with sacral pain. Initial encounter. EXAM: PELVIS - 1-2 VIEW COMPARISON:  Lumbar spine radiograph performed 03/14/2009 FINDINGS: There is no evidence of fracture or dislocation. Both femoral heads are seated normally within their respective acetabula. Mild degenerative change is noted at the lower lumbar spine. The sacroiliac joints are unremarkable in appearance. The sacrum is not well assessed on this image. The visualized bowel gas pattern is grossly unremarkable in appearance. A moderate amount of stool is seen in the colon. IMPRESSION: No evidence of fracture or dislocation. Electronically Signed   By: Garald Balding M.D.   On: 11/16/2015 18:39   Dg Sacrum/coccyx  11/16/2015  CLINICAL DATA:  Status post fall, with sacral pain. Initial encounter. EXAM: SACRUM AND COCCYX - 2+ VIEW COMPARISON:  Lumbar spine radiographs performed 03/14/2009 FINDINGS: The sacrum and coccyx demonstrate grossly normal alignment, without definite evidence of fracture. Mild degenerative change is noted at the lower lumbar spine. The sacroiliac joints demonstrate mild sclerosis. The visualized portions of the hips are grossly unremarkable. IMPRESSION: No evidence of fracture. Electronically Signed   By: Garald Balding M.D.   On: 11/16/2015 18:40   Dg Knee Complete 4 Views Left  11/16/2015  CLINICAL DATA:  Status post fall, with bilateral knee pain. Initial encounter. EXAM: LEFT KNEE - COMPLETE 4+ VIEW COMPARISON:  None. FINDINGS: There is no evidence of fracture or dislocation. Mild chondrocalcinosis is noted. There is diffuse osteopenia of visualized osseous structures. The joint spaces are preserved. No significant degenerative change is seen; the patellofemoral joint is grossly unremarkable in appearance. Trace knee joint fluid remains within normal limits. Soft tissue swelling is noted superior to the patella. IMPRESSION: 1. No evidence of fracture or dislocation. 2. Diffuse osteopenia of visualized osseous structures. 3. Mild chondrocalcinosis noted. 4. Soft tissue swelling superior  to the patella. Electronically Signed   By: Garald Balding M.D.   On: 11/16/2015 18:38   Dg Knee Complete 4 Views Right  11/16/2015  CLINICAL DATA:  Status post fall, with bilateral knee pain. Initial encounter. EXAM: RIGHT KNEE - COMPLETE 4+ VIEW COMPARISON:  None. FINDINGS: There is no evidence of fracture or dislocation. Marginal osteophyte formation is noted at the lateral compartment, and tibial spine osteophytes are seen. The joint spaces are preserved. No significant joint effusion is seen. The visualized soft tissues are normal in appearance. IMPRESSION: 1. No evidence of fracture or dislocation. 2. Mild osteoarthritis noted at the right knee. Electronically Signed   By: Garald Balding M.D.   On: 11/16/2015 18:41     EKG: Not done in ED, will get one.   Assessment/Plan Principal Problem:   UTI (lower urinary tract infection) Active Problems:   Arthritis   Fall   Hypokalemia   Elevated blood pressure   Protein-calorie malnutrition, moderate (HCC)   UTI (lower urinary tract infection): Patient has mild burning on urination, plus positive urinalysis, indicating UTI. Patient is nonseptic. -Please patient on  med-Surg bed for observation -IV rocephin -f/u Bx and Ux  Fall: X-ray of bilateral knees, pelvis and sacrum/coccyx are negative. X-ray of lumbar spin showed qeuestionable mild superior endplate compression deformity of L4, age-indeterminate. -Pain control: When necessary Percocet -PT/OT -consult to SW and CM  Hypokalemia: K=3.2  on admission. - Repleted - Check Mg level  Elevated blood pressure: Blood pressure 186/89. No history of hypertension. Likely due to pain. -When necessary hydralazine -Pain control as above  Protein-calorie malnutrition, moderate ([place]): -Ensure  Left breast mass: very concerning for breast cancer.  -Please call oncology in morning   DVT ppx: Q Lovenox Code Status: Full code Family Communication: None at bed side. Disposition Plan:  Anticipate discharge back to previous home environment Consults called:  none Admission status: medical floor/obs  Date of Service 11/17/2015    Ivor Costa [facility] Pager [phone]  If 7PM-7AM, please contact night-coverage [email or web address] Password [facility] 11/17/2015, 6:28 AM

## 2015-11-16 NOTE — ED Notes (Addendum)
Pt here via EMS following a fall at 2300 last night while she was trying to open a window. Pt has sacral pain and bilateral knee pain. Pt has a history of arthritis in her knees. Pt uses a walker to walk at home, but states she is only able to stand, not ambulate at this time. Pt called her son this morning who called EMS to her house.  Pt has a small abrasion on her right elbow and has small green spots on her inner left thigh and her left foot

## 2015-11-16 NOTE — Progress Notes (Signed)
PHARMACIST - PHYSICIAN ORDER COMMUNICATION ° °CONCERNING: P&T Medication Policy on Herbal Medications ° °DESCRIPTION:  This patient’s order for:  Glucosamine-Chondroitin  has been noted. ° °This product(s) is classified as an “herbal” or natural product. °Due to a lack of definitive safety studies or FDA approval, nonstandard manufacturing practices, plus the potential risk of unknown drug-drug interactions while on inpatient medications, the Pharmacy and Therapeutics Committee does not permit the use of “herbal” or natural products of this type within [place]. °  °ACTION TAKEN: °The pharmacy department is unable to verify this order at this time and your patient has been informed of this safety policy. °Please reevaluate patient’s clinical condition at discharge and address if the herbal or natural product(s) should be resumed at that time.  ° °Guila Owensby, PharmD °

## 2015-11-16 NOTE — ED Notes (Signed)
Pt has a reddened area next to her left areola. This was noticed while getting pt cleaned up and ready to go upstairs

## 2015-11-17 DIAGNOSIS — E440 Moderate protein-calorie malnutrition: Secondary | ICD-10-CM

## 2015-11-17 DIAGNOSIS — S32040A Wedge compression fracture of fourth lumbar vertebra, initial encounter for closed fracture: Secondary | ICD-10-CM | POA: Diagnosis not present

## 2015-11-17 DIAGNOSIS — N390 Urinary tract infection, site not specified: Secondary | ICD-10-CM | POA: Diagnosis not present

## 2015-11-17 DIAGNOSIS — W19XXXD Unspecified fall, subsequent encounter: Secondary | ICD-10-CM

## 2015-11-17 DIAGNOSIS — M1990 Unspecified osteoarthritis, unspecified site: Secondary | ICD-10-CM

## 2015-11-17 DIAGNOSIS — R030 Elevated blood-pressure reading, without diagnosis of hypertension: Secondary | ICD-10-CM

## 2015-11-17 DIAGNOSIS — E876 Hypokalemia: Secondary | ICD-10-CM

## 2015-11-17 DIAGNOSIS — N6320 Unspecified lump in the left breast, unspecified quadrant: Secondary | ICD-10-CM | POA: Diagnosis present

## 2015-11-17 DIAGNOSIS — N63 Unspecified lump in breast: Secondary | ICD-10-CM | POA: Diagnosis not present

## 2015-11-17 LAB — CBC WITH DIFFERENTIAL/PLATELET
BASOS ABS: 0 10*3/uL (ref 0.0–0.1)
BASOS PCT: 0 %
EOS PCT: 1 %
Eosinophils Absolute: 0 10*3/uL (ref 0.0–0.7)
HEMATOCRIT: 41 % (ref 36.0–46.0)
Hemoglobin: 13.7 g/dL (ref 12.0–15.0)
Lymphocytes Relative: 11 %
Lymphs Abs: 0.8 10*3/uL (ref 0.7–4.0)
MCH: 33.2 pg (ref 26.0–34.0)
MCHC: 33.4 g/dL (ref 30.0–36.0)
MCV: 99.3 fL (ref 78.0–100.0)
MONO ABS: 0.5 10*3/uL (ref 0.1–1.0)
Monocytes Relative: 6 %
NEUTROS ABS: 6.1 10*3/uL (ref 1.7–7.7)
Neutrophils Relative %: 82 %
PLATELETS: 113 10*3/uL — AB (ref 150–400)
RBC: 4.13 MIL/uL (ref 3.87–5.11)
RDW: 14.2 % (ref 11.5–15.5)
WBC: 7.5 10*3/uL (ref 4.0–10.5)

## 2015-11-17 LAB — BASIC METABOLIC PANEL
ANION GAP: 4 — AB (ref 5–15)
BUN: 23 mg/dL — ABNORMAL HIGH (ref 6–20)
CALCIUM: 8.5 mg/dL — AB (ref 8.9–10.3)
CO2: 28 mmol/L (ref 22–32)
Chloride: 109 mmol/L (ref 101–111)
Creatinine, Ser: 0.52 mg/dL (ref 0.44–1.00)
GLUCOSE: 102 mg/dL — AB (ref 65–99)
Potassium: 3.6 mmol/L (ref 3.5–5.1)
SODIUM: 141 mmol/L (ref 135–145)

## 2015-11-17 LAB — MYOGLOBIN, SERUM: Myoglobin: 452 ng/mL — ABNORMAL HIGH (ref 25–58)

## 2015-11-17 LAB — MAGNESIUM: MAGNESIUM: 1.9 mg/dL (ref 1.7–2.4)

## 2015-11-17 MED ORDER — ENOXAPARIN SODIUM 30 MG/0.3ML ~~LOC~~ SOLN
30.0000 mg | SUBCUTANEOUS | Status: DC
  Administered 2015-11-17: 30 mg via SUBCUTANEOUS
  Filled 2015-11-17: qty 0.3

## 2015-11-17 MED ORDER — SODIUM CHLORIDE 0.9 % IV SOLN
INTRAVENOUS | Status: DC
  Administered 2015-11-17 – 2015-11-18 (×2): via INTRAVENOUS

## 2015-11-17 MED ORDER — SODIUM CHLORIDE 0.9 % IV SOLN
INTRAVENOUS | Status: DC
  Filled 2015-11-17: qty 1000

## 2015-11-17 MED ORDER — ACETAMINOPHEN 500 MG PO TABS
500.0000 mg | ORAL_TABLET | Freq: Three times a day (TID) | ORAL | Status: DC
  Administered 2015-11-17 – 2015-11-18 (×3): 500 mg via ORAL
  Filled 2015-11-17 (×3): qty 1

## 2015-11-17 NOTE — Evaluation (Addendum)
Physical Therapy Evaluation Patient Details Name: Alayzha Cerrito MRN: [id number] DOB: 16-Jun-1917 Today's Date: 11/17/2015   History of Present Illness  Pt admitted s/p fall at home. Pt with UTI, ? L4 compression fx, sacral pain.  Clinical Impression  Pt admitted with above diagnosis. Pt currently with functional limitations due to the deficits listed below (see PT Problem List). Pt took several pivotal steps to recliner with RW and min A, distance limited by pain. ST-SNF recommended. Pt will benefit from skilled PT to increase their independence and safety with mobility to allow discharge to the venue listed below.       Follow Up Recommendations SNF    Equipment Recommendations  None recommended by PT    Recommendations for Other Services OT consult     Precautions / Restrictions Precautions Precautions: Fall Precaution Comments: pt denies prior falls in past year Restrictions Weight Bearing Restrictions: No      Mobility  Bed Mobility Overal bed mobility: Needs Assistance Bed Mobility: Rolling;Sidelying to Sit Rolling: Min assist Sidelying to sit: Mod assist Supine to sit: Mod assist Sit to supine: Mod assist   General bed mobility comments: assist to initiate movement and to raise trunk  Transfers Overall transfer level: Needs assistance Equipment used: Rolling walker (2 wheeled) Transfers: Sit to/from Stand Sit to Stand: Mod assist         General transfer comment: assist to rise  Ambulation/Gait Ambulation/Gait assistance: Min assist Ambulation Distance (Feet): 4 Feet Assistive device: Rolling walker (2 wheeled) Gait Pattern/deviations: Decreased step length - right;Decreased step length - left;Shuffle   Gait velocity interpretation: Below normal speed for age/gender General Gait Details: pivotal steps to recliner, distance limited by pain, min A for maneuvering RW  Stairs            Wheelchair Mobility    Modified Rankin (Stroke Patients  Only)       Balance Overall balance assessment: Needs assistance;History of Falls   Sitting balance-Leahy Scale: Fair       Standing balance-Leahy Scale: Poor                               Pertinent Vitals/Pain Pain Assessment: Faces Pain Score: 8  Pain Location: tail bone Pain Descriptors / Indicators: Sharp Pain Intervention(s): Limited activity within patient's tolerance;Premedicated before session;Repositioned;Monitored during session    Home Living Family/patient expects to be discharged to:: Skilled nursing facility Living Arrangements: Alone                    Prior Function Level of Independence: Independent with assistive device(s)         Comments: independent with rollator, doesn't drive, lives alone, 2 steps to enter home     Hand Dominance        Extremity/Trunk Assessment   Upper Extremity Assessment: Generalized weakness           Lower Extremity Assessment: Generalized weakness      Cervical / Trunk Assessment: Kyphotic  Communication   Communication: HOH  Cognition Arousal/Alertness: Awake/alert Behavior During Therapy: WFL for tasks assessed/performed Overall Cognitive Status: No family/caregiver present to determine baseline cognitive functioning                      General Comments      Exercises        Assessment/Plan  Pt needs further PT.   PT Assessment    PT Diagnosis  Difficulty walking;Generalized weakness;Acute pain   PT Problem List  decreased strength, decreased mobility, pain  PT Treatment Interventions   gait training, therapeutic activity, therapeutic exercise  PT Goals (Current goals can be found in the Care Plan section) Acute Rehab PT Goals Patient Stated Goal: to get stronger PT Goal Formulation: With patient/family Time For Goal Achievement: 12/01/15 Potential to Achieve Goals: Fair    Frequency  3x/week   Barriers to discharge        Co-evaluation                End of Session Equipment Utilized During Treatment: Gait belt Activity Tolerance: Patient limited by pain Patient left: in chair;with call bell/phone within reach;with chair alarm set Nurse Communication: Mobility status    Functional Assessment Tool Used: clinical judgement Functional Limitation: Mobility: Walking and moving around Mobility: Walking and Moving Around Current Status [phone]): At least 40 percent but less than 60 percent impaired, limited or restricted Mobility: Walking and Moving Around Goal Status [phone]): At least 20 percent but less than 40 percent impaired, limited or restricted    Time: 1107-1131 PT Time Calculation (min) (ACUTE ONLY): 24 min   Charges:   PT Evaluation $PT Eval Low Complexity: 1 Procedure PT Treatments $Therapeutic Activity: 8-22 mins   PT G Codes:   PT G-Codes **NOT FOR INPATIENT CLASS** Functional Assessment Tool Used: clinical judgement Functional Limitation: Mobility: Walking and moving around Mobility: Walking and Moving Around Current Status [phone]): At least 40 percent but less than 60 percent impaired, limited or restricted Mobility: Walking and Moving Around Goal Status [phone]): At least 20 percent but less than 40 percent impaired, limited or restricted    Philomena Doheny 11/17/2015, 11:38 AM [phone]

## 2015-11-17 NOTE — Progress Notes (Signed)
PROGRESS NOTE    Brooke Perkins  [id number] DOB: Nov 03, 1916 DOA: 11/16/2015 PCP: No primary care provider on file.   Assessment & Plan:   Principal Problem:   UTI (lower urinary tract infection) Active Problems:   Arthritis   Fall   Hypokalemia   Elevated blood pressure   Protein-calorie malnutrition, moderate (HCC)   Left breast mass  #1 urinary tract infection Check urine cultures. Continue IV Rocephin.  #2 left breast mass Oncology consultation pending.  #3 hypokalemia Replete.  #4 moderate protein calorie malnutrition Nutritional supplementation.  #5 elevated blood pressure Likely secondary to pain. Improved. Follow. Supportive care.  #6 fall Likely secondary to debility and possibly UTI. Plain films of the bilateral knees, pelvis and sacrum/coccyx are negative. X-ray of the L-spine showed questionable superior endplate compression deformity of L4 age-indeterminate. Pain management. PT/OT. Likely needs skilled nursing facility.   DVT prophylaxis: Lovenox Code Status: Full Family Communication: Updated patient. No family at bedside. Disposition Plan: Pending evaluation with PT/OT. Probably SNF.   Consultants:   Oncology pending  Procedures:   Painful so the L-spine 11/16/2015  Plain films of the pelvis 11/16/2015  Plain films of the sacrum/coccyx 11/16/2015  Plain films of bilateral knees 11/16/2015    Antimicrobials:   IV Rocephin 11/16/2015   Subjective: Patient complaining of pain in her tailbone. No chest pain. No shortness of breath.  Objective: Filed Vitals:   11/16/15 2356 11/17/15 0005 11/17/15 0532 11/17/15 1000  BP:  148/75 169/80 151/87  Pulse:  89 81 82  Temp:  98.2 F (36.8 C) 98.2 F (36.8 C) 97.5 F (36.4 C)  TempSrc:  Oral Oral Oral  Resp:  16 18 16   Height: 5\' 4"  (1.626 m)     Weight: 44.5 kg (98 lb 1.7 oz)     SpO2:  90% 92% 94%    Intake/Output Summary (Last 24 hours) at 11/17/15 1122 Last data filed at  11/17/15 0931  Gross per 24 hour  Intake    120 ml  Output    150 ml  Net    -30 ml   Filed Weights   11/16/15 1626 11/16/15 2356  Weight: 46.72 kg (103 lb) 44.5 kg (98 lb 1.7 oz)    Examination:  General exam: Appears calm and comfortable. Cachectic. Frail. Respiratory system: Clear to auscultation. Respiratory effort normal. Cardiovascular system: S1 & S2 heard, RRR. No JVD, murmurs, rubs, gallops or clicks. No pedal edema. Gastrointestinal system: Abdomen is nondistended, soft and nontender. No organomegaly or masses felt. Normal bowel sounds heard. Central nervous system: Alert and oriented. No focal neurological deficits. Extremities: Symmetric 5 x 5 power. Skin: Left breast with large mass located in the central of the breast with skin erosion and 6 x 8 cm in size, hard, immobile. Psychiatry: Judgement and insight appear normal. Mood & affect appropriate.     Data Reviewed: I have personally reviewed following labs and imaging studies  CBC:  Recent Labs Lab 11/16/15 1659 11/17/15 0907  WBC 9.2 7.5  NEUTROABS 7.9* 6.1  HGB 13.8 13.7  HCT 42.5 41.0  MCV 100.2* 99.3  PLT 125* 123456*   Basic Metabolic Panel:  Recent Labs Lab 11/16/15 1659 11/17/15 0418 11/17/15 0907  NA 142  --  141  K 3.2*  --  3.6  CL 105  --  109  CO2 29  --  28  GLUCOSE 104*  --  102*  BUN 32*  --  23*  CREATININE 0.74  --  0.52  CALCIUM 9.3  --  8.5*  MG  --  1.9  --    GFR: Estimated Creatinine Clearance: 28.9 mL/min (by C-G formula based on Cr of 0.52). Liver Function Tests:  Recent Labs Lab 11/16/15 1659  AST 44*  ALT 26  ALKPHOS 52  BILITOT 1.2  PROT 6.5  ALBUMIN 3.8   No results for input(s): LIPASE, AMYLASE in the last 168 hours. No results for input(s): AMMONIA in the last 168 hours. Coagulation Profile: No results for input(s): INR, PROTIME in the last 168 hours. Cardiac Enzymes:  Recent Labs Lab 11/16/15 1659  CKTOTAL 551*   BNP (last 3 results) No  results for input(s): PROBNP in the last 8760 hours. HbA1C: No results for input(s): HGBA1C in the last 72 hours. CBG: No results for input(s): GLUCAP in the last 168 hours. Lipid Profile: No results for input(s): CHOL, HDL, LDLCALC, TRIG, CHOLHDL, LDLDIRECT in the last 72 hours. Thyroid Function Tests: No results for input(s): TSH, T4TOTAL, FREET4, T3FREE, THYROIDAB in the last 72 hours. Anemia Panel: No results for input(s): VITAMINB12, FOLATE, FERRITIN, TIBC, IRON, RETICCTPCT in the last 72 hours. Sepsis Labs: No results for input(s): PROCALCITON, LATICACIDVEN in the last 168 hours.  No results found for this or any previous visit (from the past 240 hour(s)).       Radiology Studies: Dg Lumbar Spine Complete  11/16/2015  CLINICAL DATA:  82 year old female with lumbosacral back pain. EXAM: LUMBAR SPINE - COMPLETE 4+ VIEW COMPARISON:  Radiographs 01/20/2010 FINDINGS: Increased dextroscoliotic curvature of the lumbar spine. Progressive disc space narrowing and endplate spurring throughout most prominent at L1-L2. Progressive facet arthropathy at L4-L5 and L5-S1. Questionable superior endplate compression deformity of L4 versus positioning. Vertebral body heights are otherwise maintained. The bones are under mineralized. IMPRESSION: 1. Questionable mild superior endplate compression deformity of L4, age-indeterminate, versus positioning. 2. Progressive scoliosis, degenerative disc disease, and facet arthropathy since exam 6 years prior. Electronically Signed   By: Jeb Levering M.D.   On: 11/16/2015 21:12   Dg Pelvis 1-2 Views  11/16/2015  CLINICAL DATA:  Status post fall, with sacral pain. Initial encounter. EXAM: PELVIS - 1-2 VIEW COMPARISON:  Lumbar spine radiograph performed 03/14/2009 FINDINGS: There is no evidence of fracture or dislocation. Both femoral heads are seated normally within their respective acetabula. Mild degenerative change is noted at the lower lumbar spine. The  sacroiliac joints are unremarkable in appearance. The sacrum is not well assessed on this image. The visualized bowel gas pattern is grossly unremarkable in appearance. A moderate amount of stool is seen in the colon. IMPRESSION: No evidence of fracture or dislocation. Electronically Signed   By: Garald Balding M.D.   On: 11/16/2015 18:39   Dg Sacrum/coccyx  11/16/2015  CLINICAL DATA:  Status post fall, with sacral pain. Initial encounter. EXAM: SACRUM AND COCCYX - 2+ VIEW COMPARISON:  Lumbar spine radiographs performed 03/14/2009 FINDINGS: The sacrum and coccyx demonstrate grossly normal alignment, without definite evidence of fracture. Mild degenerative change is noted at the lower lumbar spine. The sacroiliac joints demonstrate mild sclerosis. The visualized portions of the hips are grossly unremarkable. IMPRESSION: No evidence of fracture. Electronically Signed   By: Garald Balding M.D.   On: 11/16/2015 18:40   Dg Knee Complete 4 Views Left  11/16/2015  CLINICAL DATA:  Status post fall, with bilateral knee pain. Initial encounter. EXAM: LEFT KNEE - COMPLETE 4+ VIEW COMPARISON:  None. FINDINGS: There is no evidence of fracture or dislocation. Mild chondrocalcinosis is  noted. There is diffuse osteopenia of visualized osseous structures. The joint spaces are preserved. No significant degenerative change is seen; the patellofemoral joint is grossly unremarkable in appearance. Trace knee joint fluid remains within normal limits. Soft tissue swelling is noted superior to the patella. IMPRESSION: 1. No evidence of fracture or dislocation. 2. Diffuse osteopenia of visualized osseous structures. 3. Mild chondrocalcinosis noted. 4. Soft tissue swelling superior to the patella. Electronically Signed   By: Garald Balding M.D.   On: 11/16/2015 18:38   Dg Knee Complete 4 Views Right  11/16/2015  CLINICAL DATA:  Status post fall, with bilateral knee pain. Initial encounter. EXAM: RIGHT KNEE - COMPLETE 4+ VIEW  COMPARISON:  None. FINDINGS: There is no evidence of fracture or dislocation. Marginal osteophyte formation is noted at the lateral compartment, and tibial spine osteophytes are seen. The joint spaces are preserved. No significant joint effusion is seen. The visualized soft tissues are normal in appearance. IMPRESSION: 1. No evidence of fracture or dislocation. 2. Mild osteoarthritis noted at the right knee. Electronically Signed   By: Garald Balding M.D.   On: 11/16/2015 18:41        Scheduled Meds: . acetaminophen  500 mg Oral TID  . cefTRIAXone (ROCEPHIN)  IV  1 g Intravenous Q24H  . cholecalciferol  1,000 Units Oral Daily  . enoxaparin (LOVENOX) injection  30 mg Subcutaneous Q24H  . feeding supplement (ENSURE ENLIVE)  237 mL Oral BID BM  . vitamin B-12  100 mcg Oral Daily  . vitamin C  500 mg Oral Daily   Continuous Infusions: . sodium chloride 75 mL/hr (11/17/15 0931)        Time spent: 24 minutes    Clemencia Helzer, MD [facility] Pager [phone]  If 7PM-7AM, please contact night-coverage [email or web address] Password [email or web address] 11/17/2015, 11:22 AM

## 2015-11-17 NOTE — NC FL2 (Signed)
[place] LEVEL OF CARE SCREENING TOOL     IDENTIFICATION  Patient Name: Brooke Perkins Birthdate: Jan 17, 1917 Sex: female Admission Date (Current Location): 11/16/2015  [facility] and [place] Number:  Herbalist and Address:  [facility],  [place], [place]      Provider Number: [id number]  Attending Physician Name and Address:  Eugenie Filler, MD  Relative Name and Phone Number:       Current Level of Care: Hospital Recommended Level of Care: [place] Prior Approval Number:    Date Approved/Denied:   PASRR Number: [id number]  Discharge Plan: SNF    Current Diagnoses: Patient Active Problem List   Diagnosis Date Noted  . Left breast mass 11/17/2015  . Fall 11/16/2015  . Hypokalemia 11/16/2015  . Elevated blood pressure 11/16/2015  . Ambulatory dysfunction 11/16/2015  . UTI (lower urinary tract infection) 11/16/2015  . Protein-calorie malnutrition, moderate ([place]) 11/16/2015  . Arthritis   . Compression fracture of L4 lumbar vertebra (HCC)     Orientation RESPIRATION BLADDER Height & Weight     Self, Time, Situation, Place  Normal Continent Weight: 44.5 kg (98 lb 1.7 oz) Height:  5\' 4"  (162.6 cm)  BEHAVIORAL SYMPTOMS/MOOD NEUROLOGICAL BOWEL NUTRITION STATUS  Other (Comment) (no behaviors)   Continent Diet  AMBULATORY STATUS COMMUNICATION OF NEEDS Skin   Limited Assist Verbally Normal                       Personal Care Assistance Level of Assistance  Bathing, Feeding, Dressing Bathing Assistance: Limited assistance Feeding assistance: Independent Dressing Assistance: Limited assistance     Functional Limitations Info  Sight, Hearing, Speech Sight Info: Adequate Hearing Info: Impaired Speech Info: Adequate    SPECIAL CARE FACTORS FREQUENCY  PT (By licensed PT), OT (By licensed OT)     PT Frequency: 5 x wk OT Frequency: 5 x wk            Contractures Contractures  Info: Not present    Additional Factors Info  Code Status Code Status Info: FULL             Current Medications (11/17/2015):  This is the current hospital active medication list Current Facility-Administered Medications  Medication Dose Route Frequency Provider Last Rate Last Dose  . 0.9 %  sodium chloride infusion   Intravenous Continuous Eugenie Filler, MD 75 mL/hr at 11/17/15 0931 75 mL/hr at 11/17/15 0931  . acetaminophen (TYLENOL) tablet 500 mg  500 mg Oral TID Eugenie Filler, MD   Stopped at 11/17/15 1100  . acetaminophen (TYLENOL) tablet 650 mg  650 mg Oral Q8H PRN Ivor Costa, MD      . cefTRIAXone (ROCEPHIN) 1 g in dextrose 5 % 50 mL IVPB  1 g Intravenous Q24H Ivor Costa, MD 100 mL/hr at 11/16/15 2325 1 g at 11/16/15 2325  . cholecalciferol (VITAMIN D) tablet 1,000 Units  1,000 Units Oral Daily Ivor Costa, MD   1,000 Units at 11/17/15 1004  . enoxaparin (LOVENOX) injection 30 mg  30 mg Subcutaneous Q24H Dara Hoyer, RPH      . feeding supplement (ENSURE ENLIVE) (ENSURE ENLIVE) liquid 237 mL  237 mL Oral BID BM Ivor Costa, MD   237 mL at 11/17/15 0948  . hydrALAZINE (APRESOLINE) injection 2 mg  2 mg Intravenous Q2H PRN Ivor Costa, MD      . morphine 2 MG/ML injection 1 mg  1  mg Intravenous Q3H PRN Ivor Costa, MD      . ondansetron [facility]) injection 4 mg  4 mg Intravenous Q8H PRN Ivor Costa, MD      . oxyCODONE-acetaminophen (PERCOCET/ROXICET) 5-325 MG per tablet 1 tablet  1 tablet Oral Q4H PRN Ivor Costa, MD   1 tablet at 11/17/15 [phone]  . vitamin B-12 (CYANOCOBALAMIN) tablet 100 mcg  100 mcg Oral Daily Ivor Costa, MD   100 mcg at 11/17/15 0944  . vitamin C (ASCORBIC ACID) tablet 500 mg  500 mg Oral Daily Ivor Costa, MD   500 mg at 11/17/15 [id number]     Discharge Medications: Please see discharge summary for a list of discharge medications.  Relevant Imaging Results:  Relevant Lab Results:   Additional Information ss # [id number]  Kameron Blethen, Randall An, LCSW

## 2015-11-17 NOTE — Care Management Obs Status (Signed)
[place] NOTIFICATION   Patient Details  Name: Brooke Perkins MRN: [id number] Date of Birth: 02/09/1917   Medicare Observation Status Notification Given:  Yes    Guadalupe Maple, RN 11/17/2015, 2:42 PM

## 2015-11-17 NOTE — Consult Note (Signed)
Brooke Perkins  Telephone:([phone]) [place]   Brooke Perkins  DOB: 1916/08/05  MR#: [id number]  CSN#: [id number]    Requesting Physician: [facility] Dr. Grandville Silos  Reason for consult: left breast mass   History of present illness:    Brooke Perkins is a 82 year old Caucasian Perkins, with past medical history of hearing loss, arthritis, presented to the emergency room after a fall at home. She lives alone. She also has semi-dysuria, no fever at home. In the ER, urine test was positive for UTI. She was also found to have a left breast mass, which she reports it has been there for the past 2-3 months. I was called to evaluate her left breast mass. She denies significant pain in the left breast, no discharge.   She has a son who lives in [place]. She has not told anybody about that her left breast mass, she is not ready to discuss this with her son yet. She states " I do not want to fight, I'm 82 year old." She lives independently, able to take care of herself and function well at home.   MEDICAL HISTORY:  Past Medical History  Diagnosis Date  . Arthritis     SURGICAL HISTORY: Past Surgical History  Procedure Laterality Date  . Abdominal hysterectomy    . Cholecystectomy    . Tonsillectomy    . Appendectomy      SOCIAL HISTORY: Social History   Social History  . Marital Status: Widowed    Spouse Name: N/A  . Number of Children: N/A  . Years of Education: N/A   Occupational History  . Not on file.   Social History Main Topics  . Smoking status: Never Smoker   . Smokeless tobacco: Never Used  . Alcohol Use: No  . Drug Use: No  . Sexual Activity: Not on file   Other Topics Concern  . Not on file   Social History Narrative    FAMILY HISTORY: Family History  Problem Relation Age of Onset  . Arthritis/Rheumatoid Mother     ALLERGIES:  has No Known Allergies.  MEDICATIONS:  Current  Facility-Administered Medications  Medication Dose Route Frequency Provider Last Rate Last Dose  . 0.9 %  sodium chloride infusion   Intravenous Continuous Eugenie Filler, MD      . acetaminophen (TYLENOL) tablet 650 mg  650 mg Oral Q8H PRN Ivor Costa, MD      . cefTRIAXone (ROCEPHIN) 1 g in dextrose 5 % 50 mL IVPB  1 g Intravenous Q24H Ivor Costa, MD 100 mL/hr at 11/16/15 2325 1 g at 11/16/15 2325  . cholecalciferol (VITAMIN D) tablet 1,000 Units  1,000 Units Oral Daily Ivor Costa, MD   1,000 Units at 11/17/15 0011  . enoxaparin (LOVENOX) injection 40 mg  40 mg Subcutaneous Q24H Ivor Costa, MD   40 mg at 11/17/15 0011  . feeding supplement (ENSURE ENLIVE) (ENSURE ENLIVE) liquid 237 mL  237 mL Oral BID BM Ivor Costa, MD      . hydrALAZINE (APRESOLINE) injection 2 mg  2 mg Intravenous Q2H PRN Ivor Costa, MD      . morphine 2 MG/ML injection 1 mg  1 mg Intravenous Q3H PRN Ivor Costa, MD      . ondansetron [facility]) injection 4 mg  4 mg Intravenous Q8H PRN Ivor Costa, MD      . oxyCODONE-acetaminophen (PERCOCET/ROXICET) 5-325 MG per tablet 1 tablet  1 tablet Oral Q4H PRN Soledad Gerlach  Blaine Hamper, MD   1 tablet at 11/17/15 0530  . vitamin B-12 (CYANOCOBALAMIN) tablet 100 mcg  100 mcg Oral Daily Ivor Costa, MD   100 mcg at 11/17/15 0011  . vitamin C (ASCORBIC ACID) tablet 500 mg  500 mg Oral Daily Ivor Costa, MD   500 mg at 11/17/15 0011    REVIEW OF SYSTEMS:   Constitutional: Denies fevers, chills or abnormal night sweats Eyes: Denies blurriness of vision, double vision or watery eyes Ears, nose, mouth, throat, and face: Denies mucositis or sore throat Respiratory: Denies cough, dyspnea or wheezes Cardiovascular: Denies palpitation, chest discomfort or lower extremity swelling Gastrointestinal:  Denies nausea, heartburn or change in bowel habits Skin: Denies abnormal skin rashes Lymphatics: Denies new lymphadenopathy or easy bruising Neurological:Denies numbness, tingling or new weaknesses Behavioral/Psych: Mood is  stable, no new changes  All other systems were reviewed with the patient and are negative.  PHYSICAL EXAMINATION: ECOG PERFORMANCE STATUS: 1 - Symptomatic but completely ambulatory  Filed Vitals:   11/17/15 0005 11/17/15 0532  BP: 148/75 169/80  Pulse: 89 81  Temp: 98.2 F (36.8 C) 98.2 F (36.8 C)  Resp: 16 18   Filed Weights   11/16/15 1626 11/16/15 2356  Weight: 103 lb (46.72 kg) 98 lb 1.7 oz (44.5 kg)    GENERAL:alert, no distress and comfortable SKIN: skin color, texture, turgor are normal, no rashes or significant lesions EYES: normal, conjunctiva are pink and non-injected, sclera clear OROPHARYNX:no exudate, no erythema and lips, buccal mucosa, and tongue normal  NECK: supple, thyroid normal size, non-tender, without nodularity LYMPH:  no palpable lymphadenopathy in the cervical, axillary or inguinal LUNGS: clear to auscultation and percussion with normal breathing effort HEART: regular rate & rhythm and no murmurs and no lower extremity edema ABDOMEN:abdomen soft, non-tender and normal bowel sounds Musculoskeletal:no cyanosis of digits and no clubbing  PSYCH: alert & oriented x 3 with fluent speech NEURO: no focal motor/sensory deficits Breasts: Breast inspection showed them to be symmetrical. There is a 6X4cm large mass in the last breast, in the center and UOQ, with a large skin ulcer, no discharge. It's mildly tender. There are a few small nodes in the left axilla, movable. Palpation of the right breast and axilla revealed no obvious mass that I could appreciate.   LABORATORY DATA:  I have reviewed the data as listed Lab Results  Component Value Date   WBC 9.2 11/16/2015   HGB 13.8 11/16/2015   HCT 42.5 11/16/2015   MCV 100.2* 11/16/2015   PLT 125* 11/16/2015    Recent Labs  11/16/15 1659  NA 142  K 3.2*  CL 105  CO2 29  GLUCOSE 104*  BUN 32*  CREATININE 0.74  CALCIUM 9.3  GFRNONAA >60  GFRAA >60  PROT 6.5  ALBUMIN 3.8  AST 44*  ALT 26    ALKPHOS 52  BILITOT 1.2    RADIOGRAPHIC STUDIES: I have personally reviewed the radiological images as listed and agreed with the findings in the report. Dg Lumbar Spine Complete  11/16/2015  CLINICAL DATA:  82 year old Perkins with lumbosacral back pain. EXAM: LUMBAR SPINE - COMPLETE 4+ VIEW COMPARISON:  Radiographs 01/20/2010 FINDINGS: Increased dextroscoliotic curvature of the lumbar spine. Progressive disc space narrowing and endplate spurring throughout most prominent at L1-L2. Progressive facet arthropathy at L4-L5 and L5-S1. Questionable superior endplate compression deformity of L4 versus positioning. Vertebral body heights are otherwise maintained. The bones are under mineralized. IMPRESSION: 1. Questionable mild superior endplate compression deformity of L4, age-indeterminate,  versus positioning. 2. Progressive scoliosis, degenerative disc disease, and facet arthropathy since exam 6 years prior. Electronically Signed   By: Jeb Levering M.D.   On: 11/16/2015 21:12   Dg Pelvis 1-2 Views  11/16/2015  CLINICAL DATA:  Status post fall, with sacral pain. Initial encounter. EXAM: PELVIS - 1-2 VIEW COMPARISON:  Lumbar spine radiograph performed 03/14/2009 FINDINGS: There is no evidence of fracture or dislocation. Both femoral heads are seated normally within their respective acetabula. Mild degenerative change is noted at the lower lumbar spine. The sacroiliac joints are unremarkable in appearance. The sacrum is not well assessed on this image. The visualized bowel gas pattern is grossly unremarkable in appearance. A moderate amount of stool is seen in the colon. IMPRESSION: No evidence of fracture or dislocation. Electronically Signed   By: Garald Balding M.D.   On: 11/16/2015 18:39   Dg Sacrum/coccyx  11/16/2015  CLINICAL DATA:  Status post fall, with sacral pain. Initial encounter. EXAM: SACRUM AND COCCYX - 2+ VIEW COMPARISON:  Lumbar spine radiographs performed 03/14/2009 FINDINGS: The sacrum  and coccyx demonstrate grossly normal alignment, without definite evidence of fracture. Mild degenerative change is noted at the lower lumbar spine. The sacroiliac joints demonstrate mild sclerosis. The visualized portions of the hips are grossly unremarkable. IMPRESSION: No evidence of fracture. Electronically Signed   By: Garald Balding M.D.   On: 11/16/2015 18:40   Dg Knee Complete 4 Views Left  11/16/2015  CLINICAL DATA:  Status post fall, with bilateral knee pain. Initial encounter. EXAM: LEFT KNEE - COMPLETE 4+ VIEW COMPARISON:  None. FINDINGS: There is no evidence of fracture or dislocation. Mild chondrocalcinosis is noted. There is diffuse osteopenia of visualized osseous structures. The joint spaces are preserved. No significant degenerative change is seen; the patellofemoral joint is grossly unremarkable in appearance. Trace knee joint fluid remains within normal limits. Soft tissue swelling is noted superior to the patella. IMPRESSION: 1. No evidence of fracture or dislocation. 2. Diffuse osteopenia of visualized osseous structures. 3. Mild chondrocalcinosis noted. 4. Soft tissue swelling superior to the patella. Electronically Signed   By: Garald Balding M.D.   On: 11/16/2015 18:38   Dg Knee Complete 4 Views Right  11/16/2015  CLINICAL DATA:  Status post fall, with bilateral knee pain. Initial encounter. EXAM: RIGHT KNEE - COMPLETE 4+ VIEW COMPARISON:  None. FINDINGS: There is no evidence of fracture or dislocation. Marginal osteophyte formation is noted at the lateral compartment, and tibial spine osteophytes are seen. The joint spaces are preserved. No significant joint effusion is seen. The visualized soft tissues are normal in appearance. IMPRESSION: 1. No evidence of fracture or dislocation. 2. Mild osteoarthritis noted at the right knee. Electronically Signed   By: Garald Balding M.D.   On: 11/16/2015 18:41    ASSESSMENT & PLAN: Brooke Perkins who lives independently,  presented to the ED after a fall at home, was found to have UTI and a large left breast mass.  1. Mechanical fall, no fracture. 2. UTI 3. Large left breast mass, highly suspicious for malignancy.  Recommendations: -I recommend ultrasound of the left breast and axilla for evaluation, and ultrasound-guided left breast mass biopsy by interventional radiology. We are not able to obtain mammogram in the hospital, but ultrasound should be adequate for evaluation and a biopsy. -However patient is very reluctant to do anything about her left breast mass. She seems to understand this is likely malignancy, but she is reluctant to consider any treatment due to her  advanced age. -I discussed with her that if the tumor is ER/PR positive, antiestrogen therapy would be her best choice at this point, and this therapy is usually well tolerated. -I also told her she is likely going to have pain and bleeding from the left breast mass if untreated. -Encouraged her to discuss this with her son. Her son actually came in when I was meeting her. Since she told me before that she is not ready to discuss with her son yet, so I left the room and did not talk to her son. -further staging scan can be obtained in outpt setting if she is interested in treatment  -I will be happy to see her in my clinic if she agrees.  I will follow up. Thanks for the opportunity to participate in this lady's care.   All questions were answered. The patient knows to call the clinic with any problems, questions or concerns.      Truitt Merle, MD 11/17/2015 8:29 AM

## 2015-11-17 NOTE — Evaluation (Signed)
Occupational Therapy Evaluation Patient Details Name: Brooke Perkins MRN: [id number] DOB: February 07, 1917 Today's Date: 11/17/2015    History of Present Illness Pt admitted s/p fall at home. Pt with UTI   Clinical Impression   Pt admitted with fall. Pt currently with functional limitations including back pain due to the deficits listed below (see OT Problem List).  Pt will benefit from skilled OT to increase their safety and independence with ADL and functional mobility for ADL to facilitate discharge to venue listed below.     Follow Up Recommendations  SNF    Equipment Recommendations  None recommended by OT       Precautions / Restrictions Precautions Precautions: Fall Restrictions Weight Bearing Restrictions: No      Mobility Bed Mobility Overal bed mobility: Needs Assistance Bed Mobility: Supine to Sit;Sit to Supine     Supine to sit: Mod assist Sit to supine: Mod assist      Transfers Overall transfer level: Needs assistance Equipment used: 1 person hand held assist Transfers: Sit to/from Stand Sit to Stand: Mod assist                   ADL Overall ADL's : Needs assistance/impaired     Grooming: Minimal assistance;Sitting   Upper Body Bathing: Minimal assitance;Sitting   Lower Body Bathing: Maximal assistance;Sit to/from stand       Lower Body Dressing: Maximal assistance;Sit to/from stand       Toileting- Water quality scientist and Hygiene: Sit to/from stand;Maximal assistance Toileting - Clothing Manipulation Details (indicate cue type and reason): sit to stand only.  Pain limiting,  RN and MD aware                       Pertinent Vitals/Pain Pain Assessment: Faces Pain Score: 6  Pain Location: back Pain Descriptors / Indicators: Sore Pain Intervention(s): Repositioned;Limited activity within patient's tolerance     Hand Dominance     Extremity/Trunk Assessment Upper Extremity Assessment Upper Extremity Assessment:  Generalized weakness              Cognition Arousal/Alertness: Awake/alert Behavior During Therapy: WFL for tasks assessed/performed Overall Cognitive Status: No family/caregiver present to determine baseline cognitive functioning                     General Comments   pt agreed to OOB. Pt sat EOB and performed sit to stand - then needed to return to supine due to pain            Home Living Family/patient expects to be discharged to:: Skilled nursing facility Living Arrangements: Alone                                           OT Diagnosis: Generalized weakness   OT Problem List: Decreased strength;Decreased activity tolerance;Pain   OT Treatment/Interventions: Self-care/ADL training;Patient/family education;DME and/or AE instruction    OT Goals(Current goals can be found in the care plan section) Acute Rehab OT Goals Patient Stated Goal: get well OT Goal Formulation: With patient Time For Goal Achievement: 12/01/15 Potential to Achieve Goals: Good ADL Goals Pt Will Perform Grooming: with supervision;standing Pt Will Perform Lower Body Dressing: with supervision;sit to/from stand Pt Will Transfer to Toilet: with supervision;regular height toilet;ambulating;bedside commode Pt Will Perform Toileting - Clothing Manipulation and hygiene: with supervision;sit to/from stand  OT Frequency: Min  2X/week   Barriers to D/C: Decreased caregiver support             End of Session Nurse Communication: Mobility status  Activity Tolerance: Patient limited by pain Patient left: in bed;with call bell/phone within reach   Time: 1025-1045 OT Time Calculation (min): 20 min Charges:  OT General Charges $OT Visit: 1 Procedure OT Evaluation $OT Eval Moderate Complexity: 1 Procedure G-Codes:    Betsy Pries 12/16/2015, 11:32 AM

## 2015-11-18 DIAGNOSIS — Z79899 Other long term (current) drug therapy: Secondary | ICD-10-CM | POA: Diagnosis not present

## 2015-11-18 DIAGNOSIS — Z9049 Acquired absence of other specified parts of digestive tract: Secondary | ICD-10-CM | POA: Diagnosis not present

## 2015-11-18 DIAGNOSIS — C50212 Malignant neoplasm of upper-inner quadrant of left female breast: Secondary | ICD-10-CM | POA: Diagnosis not present

## 2015-11-18 DIAGNOSIS — E785 Hyperlipidemia, unspecified: Secondary | ICD-10-CM | POA: Diagnosis not present

## 2015-11-18 DIAGNOSIS — R928 Other abnormal and inconclusive findings on diagnostic imaging of breast: Secondary | ICD-10-CM | POA: Diagnosis not present

## 2015-11-18 DIAGNOSIS — R269 Unspecified abnormalities of gait and mobility: Secondary | ICD-10-CM | POA: Diagnosis not present

## 2015-11-18 DIAGNOSIS — M6281 Muscle weakness (generalized): Secondary | ICD-10-CM | POA: Diagnosis not present

## 2015-11-18 DIAGNOSIS — R279 Unspecified lack of coordination: Secondary | ICD-10-CM | POA: Diagnosis not present

## 2015-11-18 DIAGNOSIS — D696 Thrombocytopenia, unspecified: Secondary | ICD-10-CM | POA: Diagnosis not present

## 2015-11-18 DIAGNOSIS — N6320 Unspecified lump in the left breast, unspecified quadrant: Secondary | ICD-10-CM | POA: Insufficient documentation

## 2015-11-18 DIAGNOSIS — E559 Vitamin D deficiency, unspecified: Secondary | ICD-10-CM | POA: Diagnosis not present

## 2015-11-18 DIAGNOSIS — D649 Anemia, unspecified: Secondary | ICD-10-CM | POA: Diagnosis not present

## 2015-11-18 DIAGNOSIS — E876 Hypokalemia: Secondary | ICD-10-CM | POA: Diagnosis not present

## 2015-11-18 DIAGNOSIS — W19XXXD Unspecified fall, subsequent encounter: Secondary | ICD-10-CM | POA: Diagnosis not present

## 2015-11-18 DIAGNOSIS — C50912 Malignant neoplasm of unspecified site of left female breast: Secondary | ICD-10-CM | POA: Diagnosis not present

## 2015-11-18 DIAGNOSIS — E039 Hypothyroidism, unspecified: Secondary | ICD-10-CM | POA: Diagnosis not present

## 2015-11-18 DIAGNOSIS — N939 Abnormal uterine and vaginal bleeding, unspecified: Secondary | ICD-10-CM | POA: Diagnosis present

## 2015-11-18 DIAGNOSIS — R030 Elevated blood-pressure reading, without diagnosis of hypertension: Secondary | ICD-10-CM | POA: Diagnosis not present

## 2015-11-18 DIAGNOSIS — R278 Other lack of coordination: Secondary | ICD-10-CM | POA: Diagnosis not present

## 2015-11-18 DIAGNOSIS — M25561 Pain in right knee: Secondary | ICD-10-CM | POA: Diagnosis not present

## 2015-11-18 DIAGNOSIS — M25562 Pain in left knee: Secondary | ICD-10-CM | POA: Diagnosis not present

## 2015-11-18 DIAGNOSIS — S32040A Wedge compression fracture of fourth lumbar vertebra, initial encounter for closed fracture: Secondary | ICD-10-CM | POA: Diagnosis not present

## 2015-11-18 DIAGNOSIS — R5383 Other fatigue: Secondary | ICD-10-CM | POA: Diagnosis not present

## 2015-11-18 DIAGNOSIS — H9190 Unspecified hearing loss, unspecified ear: Secondary | ICD-10-CM | POA: Diagnosis not present

## 2015-11-18 DIAGNOSIS — I10 Essential (primary) hypertension: Secondary | ICD-10-CM | POA: Diagnosis not present

## 2015-11-18 DIAGNOSIS — K529 Noninfective gastroenteritis and colitis, unspecified: Secondary | ICD-10-CM | POA: Diagnosis not present

## 2015-11-18 DIAGNOSIS — M1990 Unspecified osteoarthritis, unspecified site: Secondary | ICD-10-CM | POA: Diagnosis not present

## 2015-11-18 DIAGNOSIS — R2689 Other abnormalities of gait and mobility: Secondary | ICD-10-CM | POA: Diagnosis not present

## 2015-11-18 DIAGNOSIS — N63 Unspecified lump in breast: Secondary | ICD-10-CM | POA: Diagnosis not present

## 2015-11-18 DIAGNOSIS — N390 Urinary tract infection, site not specified: Secondary | ICD-10-CM | POA: Diagnosis not present

## 2015-11-18 DIAGNOSIS — E440 Moderate protein-calorie malnutrition: Secondary | ICD-10-CM | POA: Diagnosis not present

## 2015-11-18 DIAGNOSIS — M533 Sacrococcygeal disorders, not elsewhere classified: Secondary | ICD-10-CM | POA: Diagnosis not present

## 2015-11-18 DIAGNOSIS — K625 Hemorrhage of anus and rectum: Secondary | ICD-10-CM | POA: Diagnosis not present

## 2015-11-18 LAB — CBC
HCT: 36.7 % (ref 36.0–46.0)
HEMOGLOBIN: 11.9 g/dL — AB (ref 12.0–15.0)
MCH: 33.1 pg (ref 26.0–34.0)
MCHC: 32.4 g/dL (ref 30.0–36.0)
MCV: 101.9 fL — ABNORMAL HIGH (ref 78.0–100.0)
PLATELETS: 116 10*3/uL — AB (ref 150–400)
RBC: 3.6 MIL/uL — ABNORMAL LOW (ref 3.87–5.11)
RDW: 14.6 % (ref 11.5–15.5)
WBC: 6.6 10*3/uL (ref 4.0–10.5)

## 2015-11-18 LAB — BASIC METABOLIC PANEL
ANION GAP: 3 — AB (ref 5–15)
BUN: 19 mg/dL (ref 6–20)
CALCIUM: 8.3 mg/dL — AB (ref 8.9–10.3)
CO2: 28 mmol/L (ref 22–32)
CREATININE: 0.43 mg/dL — AB (ref 0.44–1.00)
Chloride: 109 mmol/L (ref 101–111)
GFR calc Af Amer: >60 mL/min (ref >60)
GLUCOSE: 98 mg/dL (ref 65–99)
Potassium: 3.3 mmol/L — ABNORMAL LOW (ref 3.5–5.1)
Sodium: 140 mmol/L (ref 135–145)

## 2015-11-18 LAB — URINE CULTURE: Culture: NO GROWTH

## 2015-11-18 MED ORDER — OXYCODONE-ACETAMINOPHEN 5-325 MG PO TABS: 1.0000 | ORAL_TABLET | ORAL | Status: DC | PRN

## 2015-11-18 MED ORDER — ENOXAPARIN SODIUM 30 MG/0.3ML ~~LOC~~ SOLN: 30.0000 mg | SUBCUTANEOUS | Status: DC

## 2015-11-18 MED ORDER — CEFUROXIME AXETIL 250 MG PO TABS
250.0000 mg | ORAL_TABLET | Freq: Two times a day (BID) | ORAL | Status: DC
  Administered 2015-11-18: 250 mg via ORAL
  Filled 2015-11-18 (×2): qty 1

## 2015-11-18 MED ORDER — TRAMADOL HCL 50 MG PO TABS: 50.0000 mg | ORAL_TABLET | Freq: Two times a day (BID) | ORAL | Status: DC | PRN

## 2015-11-18 MED ORDER — SENNOSIDES-DOCUSATE SODIUM 8.6-50 MG PO TABS: 1.0000 | ORAL_TABLET | Freq: Two times a day (BID) | ORAL | Status: AC

## 2015-11-18 MED ORDER — CEFUROXIME AXETIL 250 MG PO TABS
250.0000 mg | ORAL_TABLET | Freq: Two times a day (BID) | ORAL | Status: AC
Start: 2015-11-18 — End: 2015-11-23

## 2015-11-18 MED ORDER — ENSURE ENLIVE PO LIQD: 237.0000 mL | Freq: Two times a day (BID) | ORAL | Status: DC

## 2015-11-18 MED ORDER — POTASSIUM CHLORIDE CRYS ER 20 MEQ PO TBCR
40.0000 meq | EXTENDED_RELEASE_TABLET | Freq: Once | ORAL | Status: AC
  Administered 2015-11-18: 40 meq via ORAL
  Filled 2015-11-18: qty 2

## 2015-11-18 NOTE — Clinical Social Work Note (Signed)
Clinical Social Work Assessment  Patient Details  Name: Brooke Perkins MRN: [id number] Date of Birth: 10/14/1916  Date of referral:  11/18/15               Reason for consult:  Discharge Planning, Facility Placement                Permission sought to share information with:  Facility Contact Representative Permission granted to share information::  Yes, Verbal Permission Granted  Name::        Agency::     Relationship::     Contact Information:     Housing/Transportation Living arrangements for the past 2 months:  Single Family Home Source of Information:  Patient Patient Interpreter Needed:  None Criminal Activity/Legal Involvement Pertinent to Current Situation/Hospitalization:    Significant Relationships:  Adult Children Lives with:  Self Do you feel safe going back to the place where you live?   (SNF recommended) Need for family participation in patient care:  Yes (Comment)  Care giving concerns:  Pt lives alone and care cannot be managed at home following hospital d/c.   Social Worker assessment / plan:  Pt hospitalized on 11/16/15 with a UTI. Pt is under Observation status. PT has recommended SNF placement at d/c. CSW met with pt and spoke with pt's son ( Brooke Perkins [phone] ) on 11/17/15, to review PT recommendations and assist with d/c planning. Pt was in agreement with plan for ST Rehab at d/c. Pt has [employer] medicare which can be used for placement. SNF search initiated  . Bed offers provided and pt has requested [facility] for rehab. CSW will continue to follow to assist with d/c planning to SNF when stable.  Employment status:  Retired Insurance information:  Managed Medicare PT Recommendations:  Skilled Nursing Facility Information / Referral to community resources:  Skilled Nursing Facility  Patient/Family's Response to care:  Pt feels ST Rehab is needed.  Patient/Family's Understanding of and Emotional Response to Diagnosis, Current Treatment, and  Prognosis:  Pt complains of being uncomfortable. " I had a bad night.  No sleep and then they wake me up for breakfast. " Pt requesting [facility] for rehab. " I had a friend that went there and was very pleased. "  Emotional Assessment Appearance:  Appears stated age Attitude/Demeanor/Rapport:  Other (cooperative) Affect (typically observed):  Restless Orientation:  Oriented to Self, Oriented to Place, Oriented to Situation Alcohol / Substance use:  Not Applicable Psych involvement (Current and /or in the community):  No (Comment)  Discharge Needs  Concerns to be addressed:  Discharge Planning Concerns Readmission within the last 30 days:  No Current discharge risk:  None Barriers to Discharge:  No Barriers Identified   Haidinger, Jamie Lee, LCSW  [phone] 11/18/2015, 9:43 AM  

## 2015-11-18 NOTE — Clinical Social Work Placement (Signed)
   CLINICAL SOCIAL WORK PLACEMENT  NOTE  Date:  11/18/2015  Patient Details  Name: Brooke Perkins MRN: [id number] Date of Birth: [birth year]  Clinical Social Work is seeking post-discharge placement for this patient at the [place] level of care (*CSW will initial, date and re-position this form in  chart as items are completed):  Yes   Patient/family provided with [place] Work Department's list of facilities offering this level of care within the geographic area requested by the patient (or if unable, by the patient's family).  Yes   Patient/family informed of their freedom to choose among providers that offer the needed level of care, that participate in Medicare, Medicaid or managed care program needed by the patient, have an available bed and are willing to accept the patient.  Yes   Patient/family informed of [facility]'s ownership interest in [facility] and [facility], as well as of the fact that they are under no obligation to receive care at these facilities.  PASRR submitted to EDS on 11/18/15     PASRR number received on 11/18/15     Existing PASRR number confirmed on       FL2 transmitted to all facilities in geographic area requested by pt/family on 11/18/15     FL2 transmitted to all facilities within larger geographic area on       Patient informed that his/her managed care company has contracts with or will negotiate with certain facilities, including the following:        Yes   Patient/family informed of bed offers received.  Patient chooses bed at [facility]     Physician recommends and patient chooses bed at      Patient to be transferred to [facility] on 11/18/15.  Patient to be transferred to facility by PTAR     Patient family notified on 11/18/15 of transfer.  Name of family member notified:  SON     PHYSICIAN       Additional Comment:   Pt / son are in agreement with  d/c to [facility] today. [facility] transport required. Medical necessity form completed. D/C summary sent to SNF for review prior to d/c. Scripts included in d/c packet. # for report provided to nsg.   _______________________________________________ Luretha Rued, LCSW 11/18/2015, 3:57 PM

## 2015-11-18 NOTE — Clinical Social Work Placement (Signed)
   CLINICAL SOCIAL WORK PLACEMENT  NOTE  Date:  11/18/2015  Patient Details  Name: Brooke Perkins MRN: [id number] Date of Birth: 1916/07/19  Clinical Social Work is seeking post-discharge placement for this patient at the [place] level of care (*CSW will initial, date and re-position this form in  chart as items are completed):  Yes   Patient/family provided with [place] Work Department's list of facilities offering this level of care within the geographic area requested by the patient (or if unable, by the patient's family).  Yes   Patient/family informed of their freedom to choose among providers that offer the needed level of care, that participate in Medicare, Medicaid or managed care program needed by the patient, have an available bed and are willing to accept the patient.  Yes   Patient/family informed of [place]'s ownership interest in [facility] and [facility], as well as of the fact that they are under no obligation to receive care at these facilities.  PASRR submitted to EDS on 11/18/15     PASRR number received on 11/18/15     Existing PASRR number confirmed on       FL2 transmitted to all facilities in geographic area requested by pt/family on 11/18/15     FL2 transmitted to all facilities within larger geographic area on       Patient informed that his/her managed care company has contracts with or will negotiate with certain facilities, including the following:        Yes   Patient/family informed of bed offers received.  Patient chooses bed at [facility]     Physician recommends and patient chooses bed at      Patient to be transferred to   on  .  Patient to be transferred to facility by       Patient family notified on   of transfer.  Name of family member notified:        PHYSICIAN       Additional Comment:     _______________________________________________ Luretha Rued,  [place] 11/18/2015, 9:53 AM

## 2015-11-18 NOTE — Care Management Note (Signed)
Case Management Note  Patient Details  Name: Edlin Papson MRN: [id number] Date of Birth: 04/03/1917  Subjective/Objective:                  fall  Action/Plan: Discharge planning Expected Discharge Date:  11/17/15               Expected Discharge Plan:  [place]  In-House Referral:     Discharge planning Services  CM Consult  Post Acute Care Choice:    Choice offered to:     DME Arranged:    DME Agency:     HH Arranged:    [place] Agency:     Status of Service:  Completed, signed off  Medicare Important Message Given:    Date Medicare IM Given:    Medicare IM give by:    Date Additional Medicare IM Given:    Additional Medicare Important Message give by:     If discussed at [place] of Stay Meetings, dates discussed:    Additional Comments: CM notes pt to discharge to SNF; CSW arranging.  NO other CM needs were communicated. Dellie Catholic, RN 11/18/2015, 2:10 PM

## 2015-11-18 NOTE — Progress Notes (Signed)
Physical Therapy Treatment Patient Details Name: Pearlette Sylvester MRN: [id number] DOB: 22-Aug-1916 Today's Date: 11/18/2015    History of Present Illness Pt admitted s/p fall at home. Pt with UTI, ? L4 compression fx, sacral pain.    PT Comments    Assisted pt OOB to amb to bathroom required + 2 assist.  Very unsteady gait with MAX c/o R lower sacral pain.  No bruising or swelling seen.  Assisted to recliner and positioned to comfort.   Follow Up Recommendations  SNF     Equipment Recommendations       Recommendations for Other Services       Precautions / Restrictions Precautions Precautions: Fall Precaution Comments: pt denies prior falls in past year Restrictions Weight Bearing Restrictions: No    Mobility  Bed Mobility Overal bed mobility: Needs Assistance Bed Mobility: Supine to Sit     Supine to sit: Mod assist;Max assist     General bed mobility comments: assist to initiate movement and to raise trunk.  Utilized bed pad to complete scooting to EOB.    Transfers Overall transfer level: Needs assistance Equipment used: Rolling walker (2 wheeled) Transfers: Sit to/from Stand Sit to Stand: Mod assist;Max assist         General transfer comment: 50% VC's on proper tech and hand placement for safety as well as turn completion.    Ambulation/Gait Ambulation/Gait assistance: +2 safety/equipment;Mod assist Ambulation Distance (Feet): 16 Feet (8 feet x 2 to and from bathroom. ) Assistive device: Rolling walker (2 wheeled) Gait Pattern/deviations: Step-to pattern;Step-through pattern;Decreased step length - right;Decreased step length - left;Shuffle;Trunk flexed Gait velocity: decreased    General Gait Details: very unsteady gait with poor posture and difficulty advancing walker correctly.     Stairs            Wheelchair Mobility    Modified Rankin (Stroke Patients Only)       Balance                                    Cognition  Arousal/Alertness: Awake/alert Behavior During Therapy: WFL for tasks assessed/performed Overall Cognitive Status: No family/caregiver present to determine baseline cognitive functioning                      Exercises      General Comments        Pertinent Vitals/Pain Pain Assessment: Faces Faces Pain Scale: Hurts whole lot Pain Location: L lower sacrum  Pain Descriptors / Indicators: Stabbing Pain Intervention(s): Monitored during session    Home Living                      Prior Function            PT Goals (current goals can now be found in the care plan section) Progress towards PT goals: Progressing toward goals    Frequency  Min 3X/week    PT Plan Current plan remains appropriate    Co-evaluation             End of Session Equipment Utilized During Treatment: Gait belt Activity Tolerance: Patient limited by pain Patient left: in chair;with call bell/phone within reach;with chair alarm set     Time: [id number] PT Time Calculation (min) (ACUTE ONLY): 26 min  Charges:  $Gait Training: 8-22 mins $Therapeutic Activity: 8-22 mins  G Codes:      Rica Koyanagi  PTA [facility]  Acute  Rehab Pager      [phone]

## 2015-11-18 NOTE — Discharge Summary (Signed)
Physician Discharge Summary  Barba Rekowski [id number] DOB: September 12, 1916 DOA: 11/16/2015  PCP: No primary care provider on file.  Admit date: 11/16/2015 Discharge date: 11/18/2015  Time spent: 65 minutes  Recommendations for Outpatient Follow-up:  1. Patient to be discharged to [facility] skilled nursing facility. Patient will follow-up with M.D. at the facility. Patient need a basic metabolic profile done in 1 week to follow-up on electrolytes and renal function. 2. Patient will be set up to go to the breast center by oncology for biopsy and further evaluation of breast mass. 3. Patient is to follow-up with Dr. Burr Medico, oncology in 2 weeks. Office will call with appointment time.   Discharge Diagnoses:  Principal Problem:   UTI (lower urinary tract infection) Active Problems:   Arthritis   Fall   Hypokalemia   Elevated blood pressure   Protein-calorie malnutrition, moderate (HCC)   Left breast mass   Discharge Condition: Stable and improved  Diet recommendation: Regular  Filed Weights   11/16/15 1626 11/16/15 2356  Weight: 46.72 kg (103 lb) 44.5 kg (98 lb 1.7 oz)    History of present illness:  Per Dr Renelda Mom Schwark is a 82 y.o. female with medical history significant of heard hearing, arthritis, who presented with pain over sacral area and bilateral knees after fall, burning on urination and left breast mass.  Pt reported that she fell when she was trying to open a window at about 11 PM last night. She did not have prodromal symptoms. No LOC or head injury. She developed pain over bilateral knees, and sacral area. She was unable to get up after fall last night and was stuck on the ground. She was able to get up to a phone this morning and called her son. Her son helped her up and found that patient was unable to walk due to severe pain over tailbone and the bilateral knees. Pt had bruise over right elbow, but no significant pain over that area. Patient stated that she has  mild burning on urination recently, but no dysuria or increased urinary frequency. She did not have chest pain, shortness breath, cough, nausea, vomiting, abdominal pain, diarrhea, unilateral weakness. Pt stated that she noted a mass in left breast for several months, did not seek for treatment.  ED Course: pt was found to have positive urinalysis with moderate amount of leukocyte, temperature normal, no tachycardia, no tachypnea, potassium 3.2, creatinine normal, WBC 9.2. X-ray of bilateral knees, pelvis and sacrum/coccyx are negative. X-ray of lumbar spin showed qeuestionable mild superior endplate compression deformity of L4, age-indeterminate, versus positioning, progressive scoliosis, degenerative disc disease, and facet arthropathy since exam 6 years prior. Pt was placed on med-surg bed for obs.  Hospital Course:  #1 urinary tract infection Urine cultures which were done came back negative. Patient was on IV Rocephin. Patient subsequently transitioned to oral Ceftin for 5 more days to complete a seven-day course of antibiotic treatment. Outpatient follow-up.  #2 left breast mass Patient was seen in consultation by Dr. Burr Medico, oncology who felt large left breast mass was highly suspicious for malignancy and had recommended ultrasound of the left breast and axilla for evaluation and ultrasound guided left breast mass biopsy by IR. It was noted that breast biopsy could not be done in the inpatient setting and a such patient will be set up to go to the breast center by oncology for further evaluation and then further outpatient follow-up with Dr. Burr Medico in 2 weeks post biopsy.   #3 hypokalemia Repleted.  #  4 moderate protein calorie malnutrition Nutritional supplementation.  #5 elevated blood pressure Likely secondary to pain. Improved.   #6 fall Likely secondary to debility and possibly UTI. Plain films of the bilateral knees, pelvis and sacrum/coccyx are negative. X-ray of the L-spine showed  questionable superior endplate compression deformity of L4 age-indeterminate. Pain management. Patient was seen in by physical therapy and occupational therapy were recommended skilled nursing facility. Patient will be discharged to a skilled nursing facility.    Procedures:  Painful so the L-spine 11/16/2015  Plain films of the pelvis 11/16/2015  Plain films of the sacrum/coccyx 11/16/2015  Plain films of bilateral knees 11/16/2015  Consultations:  Oncology: Dr. Burr Medico 11/17/2015  Discharge Exam: Filed Vitals:   11/18/15 1055 11/18/15 1305  BP: 147/57 139/55  Pulse: 81 81  Temp:  98.6 F (37 C)  Resp:  14    General: NAD Cardiovascular: RRR Respiratory: CTAB  Discharge Instructions   Discharge Instructions    Diet general    Complete by:  As directed      Discharge instructions    Complete by:  As directed   Patient will be set up to follow up at breast center for biopsy. Patient will follow up with Dr Burr Medico, oncology in 2 weeks. Office will call with appointment time.     Increase activity slowly    Complete by:  As directed           Current Discharge Medication List    START taking these medications   Details  cefUROXime (CEFTIN) 250 MG tablet Take 1 tablet (250 mg total) by mouth 2 (two) times daily with a meal. Take for 5 days then stop. Qty: 10 tablet, Refills: 0    enoxaparin (LOVENOX) 30 MG/0.3ML injection Inject 0.3 mLs (30 mg total) into the skin daily. Qty: 0 Syringe    feeding supplement, ENSURE ENLIVE, (ENSURE ENLIVE) LIQD Take 237 mLs by mouth 2 (two) times daily between meals. Qty: 237 mL, Refills: 12    oxyCODONE-acetaminophen (PERCOCET/ROXICET) 5-325 MG tablet Take 1 tablet by mouth every 4 (four) hours as needed for moderate pain. Qty: 15 tablet, Refills: 0    senna-docusate (SENOKOT-S) 8.6-50 MG tablet Take 1 tablet by mouth 2 (two) times daily.      CONTINUE these medications which have CHANGED   Details  traMADol (ULTRAM) 50 MG  tablet Take 1 tablet (50 mg total) by mouth 2 (two) times daily as needed for moderate pain or severe pain. Qty: 15 tablet, Refills: 0      CONTINUE these medications which have NOT CHANGED   Details  acetaminophen (TYLENOL) 650 MG CR tablet Take 650 mg by mouth every 8 (eight) hours as needed for pain.    cholecalciferol (VITAMIN D) 1000 UNITS tablet Take 1,000 Units by mouth daily.    glucosamine-chondroitin 500-400 MG tablet Take 1 tablet by mouth 3 (three) times daily.    vitamin B-12 (CYANOCOBALAMIN) 100 MCG tablet Take 100 mcg by mouth daily.    vitamin C (ASCORBIC ACID) 500 MG tablet Take 500 mg by mouth daily.       No Known Allergies Follow-up Information    Follow up with [facility] .   Specialty:  [place] information:   [place] [phone]      Follow up with Truitt Merle, MD In 2 weeks.   Specialties:  Hematology, Oncology   Why:  office will call with appointment  time.   Contact information:   [place] [address] [phone]       Please follow up.   Why:  f/u with MD at SNF       The results of significant diagnostics from this hospitalization (including imaging, microbiology, ancillary and laboratory) are listed below for reference.    Significant Diagnostic Studies: Dg Lumbar Spine Complete  11/16/2015  CLINICAL DATA:  82 year old female with lumbosacral back pain. EXAM: LUMBAR SPINE - COMPLETE 4+ VIEW COMPARISON:  Radiographs 01/20/2010 FINDINGS: Increased dextroscoliotic curvature of the lumbar spine. Progressive disc space narrowing and endplate spurring throughout most prominent at L1-L2. Progressive facet arthropathy at L4-L5 and L5-S1. Questionable superior endplate compression deformity of L4 versus positioning. Vertebral body heights are otherwise maintained. The bones are under mineralized. IMPRESSION: 1. Questionable mild superior  endplate compression deformity of L4, age-indeterminate, versus positioning. 2. Progressive scoliosis, degenerative disc disease, and facet arthropathy since exam 6 years prior. Electronically Signed   By: Jeb Levering M.D.   On: 11/16/2015 21:12   Dg Pelvis 1-2 Views  11/16/2015  CLINICAL DATA:  Status post fall, with sacral pain. Initial encounter. EXAM: PELVIS - 1-2 VIEW COMPARISON:  Lumbar spine radiograph performed 03/14/2009 FINDINGS: There is no evidence of fracture or dislocation. Both femoral heads are seated normally within their respective acetabula. Mild degenerative change is noted at the lower lumbar spine. The sacroiliac joints are unremarkable in appearance. The sacrum is not well assessed on this image. The visualized bowel gas pattern is grossly unremarkable in appearance. A moderate amount of stool is seen in the colon. IMPRESSION: No evidence of fracture or dislocation. Electronically Signed   By: Garald Balding M.D.   On: 11/16/2015 18:39   Dg Sacrum/coccyx  11/16/2015  CLINICAL DATA:  Status post fall, with sacral pain. Initial encounter. EXAM: SACRUM AND COCCYX - 2+ VIEW COMPARISON:  Lumbar spine radiographs performed 03/14/2009 FINDINGS: The sacrum and coccyx demonstrate grossly normal alignment, without definite evidence of fracture. Mild degenerative change is noted at the lower lumbar spine. The sacroiliac joints demonstrate mild sclerosis. The visualized portions of the hips are grossly unremarkable. IMPRESSION: No evidence of fracture. Electronically Signed   By: Garald Balding M.D.   On: 11/16/2015 18:40   Dg Knee Complete 4 Views Left  11/16/2015  CLINICAL DATA:  Status post fall, with bilateral knee pain. Initial encounter. EXAM: LEFT KNEE - COMPLETE 4+ VIEW COMPARISON:  None. FINDINGS: There is no evidence of fracture or dislocation. Mild chondrocalcinosis is noted. There is diffuse osteopenia of visualized osseous structures. The joint spaces are preserved. No  significant degenerative change is seen; the patellofemoral joint is grossly unremarkable in appearance. Trace knee joint fluid remains within normal limits. Soft tissue swelling is noted superior to the patella. IMPRESSION: 1. No evidence of fracture or dislocation. 2. Diffuse osteopenia of visualized osseous structures. 3. Mild chondrocalcinosis noted. 4. Soft tissue swelling superior to the patella. Electronically Signed   By: Garald Balding M.D.   On: 11/16/2015 18:38   Dg Knee Complete 4 Views Right  11/16/2015  CLINICAL DATA:  Status post fall, with bilateral knee pain. Initial encounter. EXAM: RIGHT KNEE - COMPLETE 4+ VIEW COMPARISON:  None. FINDINGS: There is no evidence of fracture or dislocation. Marginal osteophyte formation is noted at the lateral compartment, and tibial spine osteophytes are seen. The joint spaces are preserved. No significant joint effusion is seen. The visualized soft tissues are normal in appearance. IMPRESSION: 1. No evidence  of fracture or dislocation. 2. Mild osteoarthritis noted at the right knee. Electronically Signed   By: Garald Balding M.D.   On: 11/16/2015 18:41    Microbiology: Recent Results (from the past 240 hour(s))  Urine culture     Status: None   Collection Time: 11/17/15 12:49 PM  Result Value Ref Range Status   Specimen Description URINE, CATHETERIZED  Final   Special Requests NONE  Final   Culture NO GROWTH Performed at [facility]   Final   Report Status 11/18/2015 FINAL  Final     Labs: Basic Metabolic Panel:  Recent Labs Lab 11/16/15 1659 11/17/15 0418 11/17/15 0907 11/18/15 0410  NA 142  --  141 140  K 3.2*  --  3.6 3.3*  CL 105  --  109 109  CO2 29  --  28 28  GLUCOSE 104*  --  102* 98  BUN 32*  --  23* 19  CREATININE 0.74  --  0.52 0.43*  CALCIUM 9.3  --  8.5* 8.3*  MG  --  1.9  --   --    Liver Function Tests:  Recent Labs Lab 11/16/15 1659  AST 44*  ALT 26  ALKPHOS 52  BILITOT 1.2  PROT 6.5  ALBUMIN  3.8   No results for input(s): LIPASE, AMYLASE in the last 168 hours. No results for input(s): AMMONIA in the last 168 hours. CBC:  Recent Labs Lab 11/16/15 1659 11/17/15 0907 11/18/15 0410  WBC 9.2 7.5 6.6  NEUTROABS 7.9* 6.1  --   HGB 13.8 13.7 11.9*  HCT 42.5 41.0 36.7  MCV 100.2* 99.3 101.9*  PLT 125* 113* 116*   Cardiac Enzymes:  Recent Labs Lab 11/16/15 1659  CKTOTAL 551*   BNP: BNP (last 3 results) No results for input(s): BNP in the last 8760 hours.  ProBNP (last 3 results) No results for input(s): PROBNP in the last 8760 hours.  CBG: No results for input(s): GLUCAP in the last 168 hours.     SignedIrine Seal MD.  [facility] 11/18/2015, 2:49 PM

## 2015-11-18 NOTE — Progress Notes (Signed)
Initial Nutrition Assessment  DOCUMENTATION CODES:   Severe malnutrition in context of chronic illness, Underweight  INTERVENTION:  Ensure Enlive BID. Each Supplement provides 350 kcals and 20 grams of protein.   NUTRITION DIAGNOSIS:   Malnutrition related to chronic illness as evidenced by severe depletion of muscle mass, severe depletion of body fat.  GOAL:   Patient will meet greater than or equal to 90% of their needs  MONITOR:   PO intake, Supplement acceptance, Labs, Weight trends, Skin, I & O's  REASON FOR ASSESSMENT:   Low Braden    ASSESSMENT:   Pt with medical history significant of heard hearing, arthritis, who presents with pain over sacral area and bilateral knees after fall, burning on urination and left breast mass.   Pt reports eating TID and have a good appetite PTA. Pt drinks an Ensure every afternoon. Will continue to supplement diet with Ensure BID.  PO's 30-40%.  Pt denies difficulty chewing/swallowing and N/V.   No weight hx found in chart. Pt reports UBW of 115 lbs prior to RA diagnosis (pt could not report how long ago this had been). Pt reports current UBW of 100-102 lbs since losing weight d/t RA. Pt currently weighs 98 lbs.   NFPE: Severe muscle depletion, severe fat depletion, no edema. Suspect advanced age contributes to depletion. Labs reviewed; K 3.3, creat 0.43, Ca 8.3.  Meds reviewed; Vitamin D, B12, Vitamin C.  Diet Order:  Diet regular Room service appropriate?: Yes; Fluid consistency:: Thin  Skin:  Reviewed, no issues  Last BM:  unknown  Height:   Ht Readings from Last 1 Encounters:  11/16/15 5\' 4"  (1.626 m)    Weight:   Wt Readings from Last 1 Encounters:  11/16/15 98 lb 1.7 oz (44.5 kg)    Ideal Body Weight:  54.5 kg  BMI:  Body mass index is 16.83 kg/(m^2).  Estimated Nutritional Needs:   Kcal:  1100-1300  Protein:  60-70 g  Fluid:  1.3 L  EDUCATION NEEDS:   No education needs identified at this  time  Geoffery [name], [place] Dietetic Intern Pager [phone]

## 2015-11-21 ENCOUNTER — Telehealth: Payer: Self-pay | Admitting: Hematology

## 2015-11-21 ENCOUNTER — Other Ambulatory Visit: Payer: Self-pay | Admitting: Hematology

## 2015-11-21 DIAGNOSIS — N6320 Unspecified lump in the left breast, unspecified quadrant: Secondary | ICD-10-CM

## 2015-11-21 DIAGNOSIS — R278 Other lack of coordination: Secondary | ICD-10-CM | POA: Diagnosis not present

## 2015-11-21 DIAGNOSIS — N63 Unspecified lump in breast: Secondary | ICD-10-CM | POA: Diagnosis not present

## 2015-11-21 DIAGNOSIS — D696 Thrombocytopenia, unspecified: Secondary | ICD-10-CM | POA: Diagnosis not present

## 2015-11-21 DIAGNOSIS — R2689 Other abnormalities of gait and mobility: Secondary | ICD-10-CM | POA: Diagnosis not present

## 2015-11-21 NOTE — Telephone Encounter (Signed)
Please see documentation re call from Ivy at [facility] for post hospital follow up. Message forwarded to [place] and navigators.   Hi Dr. Mignon Pine,  Donah Driver from [facility] has called re scheduling a 2 week post hospital f/u for Brooke Perkins. Per the message below from Dr. Burr Medico patient is to see Dr. Burr Medico 2 weeks post biopsy. At this time there is no order or biopsy scheduled as far as I can tell. Donah Driver was informed that once I have a date for the biospy she will be contacted re follow up.   Please advise.   Thanks, Melissa     ===View-only below this line===  ----- Message -----    From: Truitt Merle, MD    Sent: 11/18/2015   2:26 PM      To: Lequita Halt, RN  Dawn,  She was admitted for UTI and fall to [facility], and they found a large left breast mass. She is 32, likely going to SNF. Could you call breast center for mammo/US and biopsy? She finally agreed to have the biopsy done today, after I talked to her twice. She may leave the hospital today or tomorrow.  Melissa, please schedule her to see me in 2 weeks after her breast biopsy.   Thanks,  Krista Blue

## 2015-12-02 ENCOUNTER — Telehealth: Payer: Self-pay | Admitting: Hematology

## 2015-12-02 NOTE — Telephone Encounter (Signed)
Received call from [place] at [facility] reporting that patient is now scheduled for [facility] 6[facility]. Serita given hospital follow up appointment for patient to see YF 12/22/2015 @ 11:30 am to arrive 11 am. Per staff message from [place] patient to be seen 2 weeks post bx - YF out of office week of 6/23.

## 2015-12-05 ENCOUNTER — Ambulatory Visit
Admission: RE | Admit: 2015-12-05 | Discharge: 2015-12-05 | Disposition: A | Discharge status: Home / Self Care | Payer: Medicare Other | Source: Ambulatory Visit | Attending: Hematology | Admitting: Hematology

## 2015-12-05 ENCOUNTER — Other Ambulatory Visit: Payer: Self-pay | Admitting: Hematology

## 2015-12-05 DIAGNOSIS — N6320 Unspecified lump in the left breast, unspecified quadrant: Secondary | ICD-10-CM

## 2015-12-05 DIAGNOSIS — N63 Unspecified lump in breast: Secondary | ICD-10-CM | POA: Diagnosis not present

## 2015-12-09 DIAGNOSIS — C50912 Malignant neoplasm of unspecified site of left female breast: Secondary | ICD-10-CM | POA: Diagnosis not present

## 2015-12-11 ENCOUNTER — Encounter (HOSPITAL_COMMUNITY): Payer: Self-pay | Admitting: Emergency Medicine

## 2015-12-11 ENCOUNTER — Emergency Department (HOSPITAL_COMMUNITY)
Admission: EM | Admit: 2015-12-11 | Discharge: 2015-12-11 | Disposition: A | Discharge status: Home / Self Care | Payer: Medicare Other | Attending: Emergency Medicine | Admitting: Emergency Medicine

## 2015-12-11 DIAGNOSIS — M1990 Unspecified osteoarthritis, unspecified site: Secondary | ICD-10-CM | POA: Diagnosis not present

## 2015-12-11 DIAGNOSIS — K529 Noninfective gastroenteritis and colitis, unspecified: Secondary | ICD-10-CM | POA: Diagnosis not present

## 2015-12-11 DIAGNOSIS — K625 Hemorrhage of anus and rectum: Secondary | ICD-10-CM | POA: Diagnosis not present

## 2015-12-11 DIAGNOSIS — R5383 Other fatigue: Secondary | ICD-10-CM | POA: Diagnosis not present

## 2015-12-11 DIAGNOSIS — Z79899 Other long term (current) drug therapy: Secondary | ICD-10-CM | POA: Diagnosis not present

## 2015-12-11 LAB — TYPE AND SCREEN
ABO/RH(D): A POS
ANTIBODY SCREEN: NEGATIVE

## 2015-12-11 LAB — CBC WITH DIFFERENTIAL/PLATELET
BASOS PCT: 0 %
Basophils Absolute: 0 10*3/uL (ref 0.0–0.1)
EOS PCT: 3 %
Eosinophils Absolute: 0.2 10*3/uL (ref 0.0–0.7)
HEMATOCRIT: 39.6 % (ref 36.0–46.0)
Hemoglobin: 12.7 g/dL (ref 12.0–15.0)
Lymphocytes Relative: 24 %
Lymphs Abs: 1.3 10*3/uL (ref 0.7–4.0)
MCH: 33 pg (ref 26.0–34.0)
MCHC: 32.1 g/dL (ref 30.0–36.0)
MCV: 102.9 fL — AB (ref 78.0–100.0)
MONO ABS: 0.5 10*3/uL (ref 0.1–1.0)
MONOS PCT: 9 %
NEUTROS ABS: 3.6 10*3/uL (ref 1.7–7.7)
Neutrophils Relative %: 64 %
PLATELETS: 149 10*3/uL — AB (ref 150–400)
RBC: 3.85 MIL/uL — ABNORMAL LOW (ref 3.87–5.11)
RDW: 14.8 % (ref 11.5–15.5)
WBC: 5.6 10*3/uL (ref 4.0–10.5)

## 2015-12-11 LAB — PROTIME-INR
INR: 1 (ref 0.00–1.49)
PROTHROMBIN TIME: 13 s (ref 11.6–15.2)

## 2015-12-11 LAB — COMPREHENSIVE METABOLIC PANEL
ALBUMIN: 3.4 g/dL — AB (ref 3.5–5.0)
ALT: 11 U/L — ABNORMAL LOW (ref 14–54)
ANION GAP: 5 (ref 5–15)
AST: 16 U/L (ref 15–41)
Alkaline Phosphatase: 88 U/L (ref 38–126)
BILIRUBIN TOTAL: 0.8 mg/dL (ref 0.3–1.2)
BUN: 18 mg/dL (ref 6–20)
CO2: 28 mmol/L (ref 22–32)
Calcium: 8.8 mg/dL — ABNORMAL LOW (ref 8.9–10.3)
Chloride: 109 mmol/L (ref 101–111)
Creatinine, Ser: 0.3 mg/dL — ABNORMAL LOW (ref 0.44–1.00)
GFR calc Af Amer: >60 mL/min (ref >60)
Glucose, Bld: 101 mg/dL — ABNORMAL HIGH (ref 65–99)
POTASSIUM: 3.2 mmol/L — AB (ref 3.5–5.1)
Sodium: 142 mmol/L (ref 135–145)
TOTAL PROTEIN: 5.7 g/dL — AB (ref 6.5–8.1)

## 2015-12-11 LAB — URINALYSIS, ROUTINE W REFLEX MICROSCOPIC
Bilirubin Urine: NEGATIVE
GLUCOSE, UA: NEGATIVE mg/dL
HGB URINE DIPSTICK: NEGATIVE
Ketones, ur: NEGATIVE mg/dL
Leukocytes, UA: NEGATIVE
Nitrite: NEGATIVE
PH: 6.5 (ref 5.0–8.0)
PROTEIN: NEGATIVE mg/dL
Specific Gravity, Urine: 1.019 (ref 1.005–1.030)

## 2015-12-11 LAB — POC OCCULT BLOOD, ED: Fecal Occult Bld: POSITIVE — AB

## 2015-12-11 LAB — ABO/RH: ABO/RH(D): A POS

## 2015-12-11 MED ORDER — CIPROFLOXACIN HCL 500 MG PO TABS: 500.0000 mg | ORAL_TABLET | Freq: Two times a day (BID) | ORAL | Status: DC

## 2015-12-11 MED ORDER — CIPROFLOXACIN HCL 500 MG PO TABS
500.0000 mg | ORAL_TABLET | Freq: Once | ORAL | Status: AC
  Administered 2015-12-11: 500 mg via ORAL
  Filled 2015-12-11: qty 1

## 2015-12-11 MED ORDER — METRONIDAZOLE 500 MG PO TABS: 500.0000 mg | ORAL_TABLET | Freq: Two times a day (BID) | ORAL | Status: DC

## 2015-12-11 MED ORDER — METRONIDAZOLE 500 MG PO TABS
500.0000 mg | ORAL_TABLET | Freq: Once | ORAL | Status: AC
  Administered 2015-12-11: 500 mg via ORAL
  Filled 2015-12-11: qty 1

## 2015-12-11 NOTE — Discharge Instructions (Signed)
Please be sure to follow up with your physicians in one week.

## 2015-12-11 NOTE — Progress Notes (Signed)
CSW staffed with Nurse and EDP. Spoke with patient at bedside with no family present. Patient reports she presents to [facility] due to "bleeding right much when I went to the bathroom". Patient reports she has been at [facility] for three to four weeks from having a previous fall and she was sent there from the hospital. Patient reports she does not have anyone at home that can care for her and she cannot walk. Patient reports she has a walker, does not have a wheelchair. Patient reports she receives assistance with bathing and dressing, feeds herself.   Staffed with EDP and he states patient's son has stated patient has to leave the facility. CSW spoke with [facility] with [facility] [phone]. She stated patient is not on her discharge list and she is there for short-term rehab. She stated patient has [employer] and when she is discharged, the nurse can call and ask for the nurse at the facility to call report. She stated patient's room there is Rm 312. Updated EDP of this information.   Genice Rouge [id number] ED CSW 12/11/2015 9:10 AM

## 2015-12-11 NOTE — ED Notes (Signed)
Pt presents to ED via [facility] with c/o new onset of vaginal bleed/clots, noticed while pt was using bathroom. Pt slightly pale and c/o lightheadedness. Pt on Lovenox. Pt alerts and oriented x4 at this time.

## 2015-12-11 NOTE — ED Notes (Signed)
Attempted to call report to [facility], waited 10 minutes on phone/hold , nobody answered .

## 2015-12-11 NOTE — ED Notes (Signed)
Bed: WA01 Expected date:  Expected time:  Means of arrival:  Comments: 82 yr old vaginal bleeding

## 2015-12-11 NOTE — ED Provider Notes (Signed)
CSN: [id number]     Arrival date & time 12/11/15  0006 History  By signing my name below, I, [name], attest that this documentation has been prepared under the direction and in the presence of Delora Fuel, MD. Electronically Signed: Judithann Sauger, ED Scribe. 12/11/2015. 1:19 AM.    Chief Complaint  Patient presents with  . Vaginal Bleeding   Patient is a 82 y.o. female presenting with vaginal bleeding. The history is provided by the patient. No language interpreter was used.  Vaginal Bleeding Quality:  Clots Severity:  Moderate Timing:  Intermittent Progression:  Unchanged Chronicity:  New Menstrual history:  Postmenopausal Context: during urination   Relieved by:  None tried Worsened by:  Nothing tried Ineffective treatments:  None tried Associated symptoms: fatigue   Associated symptoms: no abdominal pain, no fever and no nausea    HPI Comments: Brooke Perkins is a 82 y.o. female who presents to the Emergency Department by ambulance complaining by intermittent episodes of vaginal bleeding while using the bathroom PTA. She reports associated fatigue but adds that she has not been eating appropriately yesterday. She notes that she first noticed the bleeding one week ago while using the bathroom and is currently on Lovenox IM. She denies any blood in her underwear. No alleviating factors noted. Pt has not tried any medications for these episodes PTA. No syncope, numbness, abdominal pain, n/v, or fever.    Past Medical History  Diagnosis Date  . Arthritis    Past Surgical History  Procedure Laterality Date  . Abdominal hysterectomy    . Cholecystectomy    . Tonsillectomy    . Appendectomy     Family History  Problem Relation Age of Onset  . Arthritis/Rheumatoid Mother    Social History  Substance Use Topics  . Smoking status: Never Smoker   . Smokeless tobacco: Never Used  . Alcohol Use: No   OB History    No data available     Review of Systems   Constitutional: Positive for fatigue. Negative for fever.  Gastrointestinal: Negative for nausea, vomiting and abdominal pain.  Genitourinary: Positive for vaginal bleeding.  Neurological: Negative for syncope and numbness.  All other systems reviewed and are negative.     Allergies  Review of patient's allergies indicates no known allergies.  Home Medications   Prior to Admission medications   Medication Sig Start Date End Date Taking? Authorizing Provider  acetaminophen (TYLENOL) 650 MG CR tablet Take 650 mg by mouth every 8 (eight) hours as needed for pain.   Yes Historical Provider, MD  calcitonin, salmon, (MIACALCIN/FORTICAL) 200 UNIT/ACT nasal spray Place 1 spray into alternate nostrils daily.   Yes Historical Provider, MD  cholecalciferol (VITAMIN D) 1000 UNITS tablet Take 1,000 Units by mouth daily.   Yes Historical Provider, MD  enoxaparin (LOVENOX) 30 MG/0.3ML injection Inject 0.3 mLs (30 mg total) into the skin daily. 11/18/15  Yes Eugenie Filler, MD  feeding supplement, ENSURE ENLIVE, (ENSURE ENLIVE) LIQD Take 237 mLs by mouth 2 (two) times daily between meals. 11/18/15  Yes Eugenie Filler, MD  glucosamine-chondroitin 500-400 MG tablet Take 1 tablet by mouth 3 (three) times daily.   Yes Historical Provider, MD  oxyCODONE-acetaminophen (PERCOCET/ROXICET) 5-325 MG tablet Take 1 tablet by mouth every 4 (four) hours as needed for moderate pain. 11/18/15  Yes Eugenie Filler, MD  polyethylene glycol [facility] / GLYCOLAX) packet Take 17 g by mouth daily.   Yes Historical Provider, MD  senna-docusate (SENOKOT-S) 8.6-50 MG  tablet Take 1 tablet by mouth 2 (two) times daily. 11/18/15  Yes Eugenie Filler, MD  traMADol (ULTRAM) 50 MG tablet Take 1 tablet (50 mg total) by mouth 2 (two) times daily as needed for moderate pain or severe pain. 11/18/15  Yes Eugenie Filler, MD  vitamin B-12 (CYANOCOBALAMIN) 100 MCG tablet Take 100 mcg by mouth daily.   Yes Historical Provider, MD   vitamin C (ASCORBIC ACID) 500 MG tablet Take 500 mg by mouth daily.   Yes Historical Provider, MD  Vitamin D, Ergocalciferol, (DRISDOL) 50000 units CAPS capsule Take 50,000 Units by mouth every 7 (seven) days.   Yes Historical Provider, MD   BP 201/83 mmHg  Pulse 72  Temp(Src) 97.4 F (36.3 C) (Oral)  Resp 16  SpO2 96% Physical Exam  Constitutional: She is oriented to person, place, and time. She appears well-developed and well-nourished.  HENT:  Head: Normocephalic and atraumatic.  Eyes: EOM are normal. Pupils are equal, round, and reactive to light.  Neck: Normal range of motion. Neck supple. No JVD present.  Cardiovascular: Normal rate, regular rhythm and normal heart sounds.   No murmur heard. Pulmonary/Chest: Effort normal and breath sounds normal. She has no wheezes. She has no rales. She exhibits no tenderness.  Abdominal: Soft. Bowel sounds are normal. She exhibits no mass. There is no tenderness.  Genitourinary:  Decreased sphincter tone, pinkish blood present in small amounts, moderate irritation if the perianal skin, no obvious vaginal bleeding.   Musculoskeletal: Normal range of motion. She exhibits no edema.  Lymphadenopathy:    She has no cervical adenopathy.  Neurological: She is alert and oriented to person, place, and time. No cranial nerve deficit. She exhibits normal muscle tone. Coordination normal.  Skin: Skin is warm and dry. No rash noted.  Psychiatric: She has a normal mood and affect. Judgment normal.  Nursing note and vitals reviewed.   ED Course  Procedures (including critical care time) DIAGNOSTIC STUDIES: Oxygen Saturation is 96% on RA, normal by my interpretation.    COORDINATION OF CARE: 1:14 AM- Pt advised of plan for treatment and pt agrees. Pt will receive lab work and EKG for further evaluation. She will also receive a rectal examination.    Labs Review Results for orders placed or performed during the hospital encounter of 12/11/15  CBC  with Differential  Result Value Ref Range   WBC 5.6 4.0 - 10.5 K/uL   RBC 3.85 (L) 3.87 - 5.11 MIL/uL   Hemoglobin 12.7 12.0 - 15.0 g/dL   HCT 39.6 36.0 - 46.0 %   MCV 102.9 (H) 78.0 - 100.0 fL   MCH 33.0 26.0 - 34.0 pg   MCHC 32.1 30.0 - 36.0 g/dL   RDW 14.8 11.5 - 15.5 %   Platelets 149 (L) 150 - 400 K/uL   Neutrophils Relative % 64 %   Neutro Abs 3.6 1.7 - 7.7 K/uL   Lymphocytes Relative 24 %   Lymphs Abs 1.3 0.7 - 4.0 K/uL   Monocytes Relative 9 %   Monocytes Absolute 0.5 0.1 - 1.0 K/uL   Eosinophils Relative 3 %   Eosinophils Absolute 0.2 0.0 - 0.7 K/uL   Basophils Relative 0 %   Basophils Absolute 0.0 0.0 - 0.1 K/uL  Comprehensive metabolic panel  Result Value Ref Range   Sodium 142 135 - 145 mmol/L   Potassium 3.2 (L) 3.5 - 5.1 mmol/L   Chloride 109 101 - 111 mmol/L   CO2 28 22 - 32  mmol/L   Glucose, Bld 101 (H) 65 - 99 mg/dL   BUN 18 6 - 20 mg/dL   Creatinine, Ser 0.30 (L) 0.44 - 1.00 mg/dL   Calcium 8.8 (L) 8.9 - 10.3 mg/dL   Total Protein 5.7 (L) 6.5 - 8.1 g/dL   Albumin 3.4 (L) 3.5 - 5.0 g/dL   AST 16 15 - 41 U/L   ALT 11 (L) 14 - 54 U/L   Alkaline Phosphatase 88 38 - 126 U/L   Total Bilirubin 0.8 0.3 - 1.2 mg/dL   GFR calc non Af Amer >60 >60 mL/min   GFR calc Af Amer >60 >60 mL/min   Anion gap 5 5 - 15  Protime-INR  Result Value Ref Range   Prothrombin Time 13.0 11.6 - 15.2 seconds   INR 1.00 0.00 - 1.49  Urinalysis, Routine w reflex microscopic  Result Value Ref Range   Color, Urine YELLOW YELLOW   APPearance CLEAR CLEAR   Specific Gravity, Urine 1.019 1.005 - 1.030   pH 6.5 5.0 - 8.0   Glucose, UA NEGATIVE NEGATIVE mg/dL   Hgb urine dipstick NEGATIVE NEGATIVE   Bilirubin Urine NEGATIVE NEGATIVE   Ketones, ur NEGATIVE NEGATIVE mg/dL   Protein, ur NEGATIVE NEGATIVE mg/dL   Nitrite NEGATIVE NEGATIVE   Leukocytes, UA NEGATIVE NEGATIVE  POC occult blood, ED Provider will collect  Result Value Ref Range   Fecal Occult Bld POSITIVE (A) NEGATIVE   Type and screen [place]  Result Value Ref Range   ABO/RH(D) A POS    Antibody Screen NEG    Sample Expiration 12/14/2015   ABO/Rh  Result Value Ref Range   ABO/RH(D) A POS      Delora Fuel, MD has personally reviewed and evaluated these images and lab results as part of his medical decision-making.   EKG Interpretation   Date/Time:  Thursday December 11 2015 00:22:06 EDT Ventricular Rate:  65 PR Interval:  154 QRS Duration: 86 QT Interval:  465 QTC Calculation: 483 R Axis:   12 Text Interpretation:  Sinus rhythm Multiple premature complexes, vent &  supraven Nonspecific T abnormalities, lateral leads Minimal ST elevation,  inferior leads When compared with ECG of 11/16/2015, Premature ventricular  complexes are now Present Confirmed by [facility]  MD, Kimmora Risenhoover ([id number]) on  12/11/2015 12:27:12 AM      MDM   Final diagnoses:  Rectal bleeding    Patient sent from nursing home with report of vaginal bleeding. Exam does not show any blood coming from the vagina but there is some pinkish blood present in the rectal wall. This is apparently what was noted. This seems most consistent with a mild colitis. Laboratory workup shows hemoglobin is stable and actually higher than recent hemoglobin. WBC is normal and renal function is normal. Orthostatic vital signs not show any significant change in pulse or blood pressure. I do not see an indication for hospitalization at this point. However, patient is very upset with the nursing facility where she currently resides. She will be kept in the ED for social service consult to see if there are any alternate placements available for her. She'll be discharged with prescriptions for ciprofloxacin and metronidazole and will need referral to gastroenterology as an outpatient.  I personally performed the services described in this documentation, which was scribed in my presence. The recorded information has been reviewed and is accurate.       Delora Fuel, MD [id number]

## 2015-12-11 NOTE — ED Notes (Signed)
EKG given to EDP,Glick,MD., for review. 

## 2015-12-11 NOTE — ED Notes (Signed)
Called [facility] for transportation  

## 2015-12-12 ENCOUNTER — Telehealth: Payer: Self-pay | Admitting: *Deleted

## 2015-12-12 NOTE — Telephone Encounter (Signed)
Spoke to pt son and discussed appt with Dr. Burr Medico on 12/22/15. Gave navigation resources and contact information. Denies further questions at this time.

## 2015-12-16 DIAGNOSIS — M1990 Unspecified osteoarthritis, unspecified site: Secondary | ICD-10-CM | POA: Diagnosis not present

## 2015-12-16 DIAGNOSIS — R278 Other lack of coordination: Secondary | ICD-10-CM | POA: Diagnosis not present

## 2015-12-16 DIAGNOSIS — D696 Thrombocytopenia, unspecified: Secondary | ICD-10-CM | POA: Diagnosis not present

## 2015-12-16 DIAGNOSIS — R296 Repeated falls: Secondary | ICD-10-CM | POA: Diagnosis not present

## 2015-12-22 ENCOUNTER — Encounter: Payer: Self-pay | Admitting: Hematology

## 2015-12-22 ENCOUNTER — Ambulatory Visit (HOSPITAL_BASED_OUTPATIENT_CLINIC_OR_DEPARTMENT_OTHER): Payer: Medicare Other | Admitting: Hematology

## 2015-12-22 VITALS — BP 168/85 | HR 81 | Temp 98.4°F | Resp 18 | Ht 64.0 in | Wt 96.7 lb

## 2015-12-22 DIAGNOSIS — M1990 Unspecified osteoarthritis, unspecified site: Secondary | ICD-10-CM

## 2015-12-22 DIAGNOSIS — R634 Abnormal weight loss: Secondary | ICD-10-CM

## 2015-12-22 DIAGNOSIS — C50112 Malignant neoplasm of central portion of left female breast: Secondary | ICD-10-CM | POA: Diagnosis not present

## 2015-12-22 DIAGNOSIS — E46 Unspecified protein-calorie malnutrition: Secondary | ICD-10-CM | POA: Diagnosis not present

## 2015-12-22 NOTE — Progress Notes (Signed)
[place]  [phone] [phone]  Clinic Follow up Note   No care team member to display 12/22/2015  SUMMARY OF ONCOLOGIC HISTORY: Oncology History   Cancer of central portion of left female breast [facility])   Staging form: Breast, AJCC 7th Edition     Clinical stage from 12/05/2015: Stage IIB (T3, N0, M0) - Signed by Truitt Merle, MD on 12/22/2015       Cancer of central portion of left female breast ([place])   12/05/2015 Initial Diagnosis Cancer of central portion of left female breast ([place])   12/05/2015 Mammogram Your irregular fungating mass within the retroviral left breast, extending to the upper inner quadrant of the left breast, at least a 5 cm in greatest dimension, with obvious skin involvement, left axillary nodes were negative by ultrasound.   12/05/2015 Initial Biopsy Left member breast mass biopsy showed invasive lobular carcinoma   12/05/2015 Receptors her2 ER 95% positive, PR 95% positive, HER-2 negative, Ki-67 12%,   History of present illness (11/17/2015):  Brooke Perkins is a 82 year old Caucasian female, with past medical history of hearing loss, arthritis, presented to the emergency room after a fall at home. She lives alone. She also has semi-dysuria, no fever at home. In the ER, urine test was positive for UTI. She was also found to have a left breast mass, which she reports it has been there for the past 2-3 months. I was called to evaluate her left breast mass. She denies significant pain in the left breast, no discharge.   She has a son who lives in [place] [address]. She has not told anybody about that her left breast mass, she is not ready to discuss this with her son yet. She states " I do not want to fight, I'm 82 year old." She lives independently, able to take care of herself and function well at home.   INTERVAL HISTORY: Nyomie returns for follow-up. She is accompanied by her son to clinic today. I initially saw her in the hospital about months  ago, and referred her to breast Center for mammogram and left breast mass biopsy. She is here to discuss the treatment options. She is doing moderately well, she lives in assisted living, not very physically active, has not been able to walk much since her fall in April 2017, moves around in a wheelchair. She denies any significant chest pain, no other new symptoms. She has chronic low appetite,  REVIEW OF SYSTEMS:   Constitutional: Denies fevers, chills, (+) fatigue and 15lbs weight loss in the past 2-3 years Eyes: Denies blurriness of vision Ears, nose, mouth, throat, and face: Denies mucositis or sore throat Respiratory: Denies cough, dyspnea or wheezes Cardiovascular: Denies palpitation, chest discomfort or lower extremity swelling Gastrointestinal:  Denies nausea, heartburn or change in bowel habits Skin: Denies abnormal skin rashes Lymphatics: Denies new lymphadenopathy or easy bruising Neurological:Denies numbness, tingling or new weaknesses Behavioral/Psych: Mood is stable, no new changes  All other systems were reviewed with the patient and are negative.  MEDICAL HISTORY:  Past Medical History  Diagnosis Date  . Arthritis     SURGICAL HISTORY: Past Surgical History  Procedure Laterality Date  . Abdominal hysterectomy    . Cholecystectomy    . Tonsillectomy    . Appendectomy      I have reviewed the social history and family history with the patient and they are unchanged from previous note.  ALLERGIES:  has No Known Allergies.  MEDICATIONS:  Current Outpatient Prescriptions  Medication Sig Dispense Refill  . acetaminophen (TYLENOL) 650 MG CR tablet Take 650 mg by mouth every 8 (eight) hours as needed for pain.    . calcitonin, salmon, (MIACALCIN/FORTICAL) 200 UNIT/ACT nasal spray Place 1 spray into alternate nostrils daily.    . cholecalciferol (VITAMIN D) 1000 UNITS tablet Take 1,000 Units by mouth daily.    . ciprofloxacin (CIPRO) 500 MG tablet Take 1 tablet (500 mg  total) by mouth 2 (two) times daily. 14 tablet 0  . enoxaparin (LOVENOX) 30 MG/0.3ML injection Inject 0.3 mLs (30 mg total) into the skin daily. 0 Syringe   . feeding supplement, ENSURE ENLIVE, (ENSURE ENLIVE) LIQD Take 237 mLs by mouth 2 (two) times daily between meals. 237 mL 12  . glucosamine-chondroitin 500-400 MG tablet Take 1 tablet by mouth 3 (three) times daily.    . metroNIDAZOLE (FLAGYL) 500 MG tablet Take 1 tablet (500 mg total) by mouth 2 (two) times daily. 14 tablet 0  . oxyCODONE-acetaminophen (PERCOCET/ROXICET) 5-325 MG tablet Take 1 tablet by mouth every 4 (four) hours as needed for moderate pain. 15 tablet 0  . polyethylene glycol (MIRALAX / GLYCOLAX) packet Take 17 g by mouth daily.    Marland Kitchen senna-docusate (SENOKOT-S) 8.6-50 MG tablet Take 1 tablet by mouth 2 (two) times daily.    . traMADol (ULTRAM) 50 MG tablet Take 1 tablet (50 mg total) by mouth 2 (two) times daily as needed for moderate pain or severe pain. 15 tablet 0  . vitamin B-12 (CYANOCOBALAMIN) 100 MCG tablet Take 100 mcg by mouth daily.    . vitamin C (ASCORBIC ACID) 500 MG tablet Take 500 mg by mouth daily.    . Vitamin D, Ergocalciferol, (DRISDOL) 50000 units CAPS capsule Take 50,000 Units by mouth every 7 (seven) days.     No current facility-administered medications for this visit.    PHYSICAL EXAMINATION: ECOG PERFORMANCE STATUS: 4 - Bedbound  Filed Vitals:   12/22/15 1207  BP: 168/85  Pulse: 81  Temp: 98.4 F (36.9 C)  Resp: 18   Filed Weights   12/22/15 1207  Weight: 96 lb 11.2 oz (43.863 kg)    GENERAL:alert, no distress and comfortable SKIN: skin color, texture, turgor are normal, no rashes or significant lesions EYES: normal, Conjunctiva are pink and non-injected, sclera clear OROPHARYNX:no exudate, no erythema and lips, buccal mucosa, and tongue normal  NECK: supple, thyroid normal size, non-tender, without nodularity LYMPH:  no palpable lymphadenopathy in the cervical, axillary or  inguinal LUNGS: clear to auscultation and percussion with normal breathing effort HEART: regular rate & rhythm and no murmurs and no lower extremity edema ABDOMEN:abdomen soft, non-tender and normal bowel sounds Musculoskeletal:no cyanosis of digits and no clubbing  NEURO: alert & oriented x 3 with fluent speech, no focal motor/sensory deficits Breasts: Breast inspection showed them to be symmetrical with no nipple discharge. There is a 6X4cm large mass in the last breast, in the center and UOQ, with a large skin ulcer, no discharge. It's mildly tender. There are a few small nodes in the left axilla, movable. Palpation of the right breast and axilla revealed no obvious mass that I could appreciate  LABORATORY DATA:  I have reviewed the data as listed CBC Latest Ref Rng 12/11/2015 11/18/2015 11/17/2015  WBC 4.0 - 10.5 K/uL 5.6 6.6 7.5  Hemoglobin 12.0 - 15.0 g/dL 12.7 11.9(L) 13.7  Hematocrit 36.0 - 46.0 % 39.6 36.7 41.0  Platelets 150 - 400 K/uL 149(L) 116(L) 113(L)     CMP  Latest Ref Rng 12/11/2015 11/18/2015 11/17/2015  Glucose 65 - 99 mg/dL 101(H) 98 102(H)  BUN 6 - 20 mg/dL 18 19 23(H)  Creatinine 0.44 - 1.00 mg/dL 0.30(L) 0.43(L) 0.52  Sodium 135 - 145 mmol/L 142 140 141  Potassium 3.5 - 5.1 mmol/L 3.2(L) 3.3(L) 3.6  Chloride 101 - 111 mmol/L 109 109 109  CO2 22 - 32 mmol/L 28 28 28   Calcium 8.9 - 10.3 mg/dL 8.8(L) 8.3(L) 8.5(L)  Total Protein 6.5 - 8.1 g/dL 5.7(L) - -  Total Bilirubin 0.3 - 1.2 mg/dL 0.8 - -  Alkaline Phos 38 - 126 U/L 88 - -  AST 15 - 41 U/L 16 - -  ALT 14 - 54 U/L 11(L) - -   PATHOLOGY REPORT  Diagnosis 12/05/2015 Breast, left, needle core biopsy, 10:00 o'clock - INVASIVE MAMMARY CARCINOMA. - SEE COMMENT. Microscopic Comment The carcinoma appears grade 1-2. An E-Cadherin stain will be performed and the results reported separately. A breast prognostic profile will be performed and the results reported separately. The results were called to [facility] on 12/08/15. (JBK:ds 12/08/15) By immunohistochemistry, the malignant cells are negative for E-Cadherin, supporting a lobular phenotype. (JBK:ds 12/08/15)  Results: IMMUNOHISTOCHEMICAL AND MORPHOMETRIC ANALYSIS PERFORMED MANUALLY Estrogen Receptor: 95%, POSITIVE, STRONG STAINING INTENSITY Progesterone Receptor: 95%, POSITIVE, STRONG STAINING INTENSITY Proliferation Marker Ki67: 12% FLUORESCENCE IN-SITU HYBRIDIZATION Results: HER2 - NEGATIVE RATIO OF HER2/CEP17 SIGNALS 1.27 AVERAGE HER2 COPY NUMBER PER CELL 2.80   RADIOGRAPHIC STUDIES: I have personally reviewed the radiological images as listed and agreed with the findings in the report.  Diagnostic mammogram and ultrasound bilateral breast 12/05/2015 FINDINGS: There is a large irregular mass within the upper left breast, centered at the 11-12 o'clock axis region, extending from anterior to middle depth, measuring at least 5 cm greatest dimension, with associated microcalcifications, with overlying skin retraction. Bandages overlie the mass, indicating skin involvement.  There are no dominant masses, suspicious calcifications or secondary signs of malignancy within the right breast.  On physical exam, there is a fungating mass within the retroareolar left breast, extending to the upper inner quadrant, with obvious skin involvement.  Targeted ultrasound is performed, showing an irregular hypoechoic mass within the left breast at the 10 o'clock axis, 3 cm from the nipple, with internal vascularity, difficult to measure definitively due the overlying skin involvement, measuring at least 4.1 x 2 x 3.7 cm, with punctate internal echoes compatible with the associated microcalcifications seen on mammogram.  Left axilla was evaluated with ultrasound showing no enlarged or morphologically abnormal lymph nodes.  IMPRESSION: Irregular fungating mass within the retroareolar left breast, extending to the upper inner  quadrant of the left breast, measuring at least 5 cm greatest dimension based on the mammogram, with obvious skin involvement. Ultrasound-guided biopsy is recommended.   ASSESSMENT & PLAN:  82 year old Caucasian female, with past medical history of arthritis, fall, wheelchair bound, presented with a large left breast mass.  1. Central portion of left female breast, invasive lobular carcinoma, cT3N0M0, stage IIB, ER+/PR+/HER2- -I reviewed her mammogram, ultrasound, and breast mass biopsy results in details with patient and her son. -We discussed that surgical resection is the only curative treatment option. She has determined to pursue surgery, she was seen by Dr. Excell Seltzer, and the surgery was offered. I encourage her to call to  schedule her surgery. -Given her advanced age, she would not be a candidate for adjuvant chemotherapy. I do not recommend Oncotype test. -Given her strong ER and PR  positivity of her tumor, aromatase inhibitor can be considered. However she is 63, has significant arthritis, I think is also reasonable not to do adjuvant endocrine therapy, and continue breast cancer surveillance  -If surgery is not option for some reason, then palliative aromatase inhibitor to control her disease would be very reasonable, she understands this is not curable with antiestrogen therapy alone. -We discussed breast cancer surveillance after her surgery, if she is able to, we would consider annual screening mammogram of her right breast, and follow up with [place] every 6 month for lab and exam.   2. Arthritis and history of fall -She uses a wheelchair -Tylenol and tramadol as needed for pain control  3. Weight loss and malnutrition -I encouraged her to take nutrition supplement.  Plan -She will call Dr. Lear Ng office to schedule her breast surgery -I'll see her back 1 month after surgery.   All questions were answered. The patient knows to call the clinic with any problems, questions or  concerns. No barriers to learning was detected.  I spent 30 minutes counseling the patient face to face. The total time spent in the appointment was 40 minutes and more than 50% was on counseling and review of test results     Truitt Merle, MD 12/22/2015

## 2015-12-30 ENCOUNTER — Other Ambulatory Visit: Payer: Self-pay | Admitting: General Surgery

## 2015-12-31 ENCOUNTER — Encounter: Payer: Self-pay | Admitting: Skilled Nursing Facility1

## 2015-12-31 ENCOUNTER — Encounter: Payer: Self-pay | Admitting: Hematology

## 2015-12-31 NOTE — Progress Notes (Signed)
Subjective:     Patient ID: Brooke Perkins, female   DOB: 03-05-1917, 82 y.o.   MRN: [id number]  HPI   Review of Systems     Objective:   Physical Exam To assist the pt in identifying dietary strategies to gain lost wt.    Assessment:     Pt identifed as being malnourished due to lost wt. Pt contacted via the telephone at [phone]. Pt unavilable.    Plan:     Dietitian left voicemail prompting the pt to contact Ernestene Kiel CSO,RD,LDN at [phone].

## 2016-01-01 ENCOUNTER — Telehealth: Payer: Self-pay | Admitting: Hematology

## 2016-01-01 ENCOUNTER — Telehealth: Payer: Self-pay | Admitting: *Deleted

## 2016-01-01 NOTE — Telephone Encounter (Signed)
lvm for pt regarding to Aug appt... °

## 2016-01-01 NOTE — Telephone Encounter (Signed)
  Oncology Nurse Navigator Documentation    Navigator Encounter Type: Telephone (01/01/16 1100) Telephone: Outgoing Call;Medication Assistance (01/01/16 1100)  Pt son Merry Proud) in question if pt needs to start taking AI prior to sx. Informed son that d/t to the short distance when sx will be performed, she does not and will not benefit from starting AI prior to sx. Received verbal understanding. Denies further questions or needs. Contact information provided.   Surgery Date: 01/21/16 (01/01/16 1100) Treatment Initiated Date: 01/21/16 (01/01/16 1100)     Barriers/Navigation Needs: Coordination of Care (POF placed for appt with Dr. Burr Medico after sx) (01/01/16 1100)   Interventions: Coordination of Care (01/01/16 1100)                      Time Spent with Patient: 15 (01/01/16 1100)

## 2016-01-06 ENCOUNTER — Non-Acute Institutional Stay (SKILLED_NURSING_FACILITY): Payer: Medicare Other | Admitting: Internal Medicine

## 2016-01-06 ENCOUNTER — Encounter: Payer: Self-pay | Admitting: Internal Medicine

## 2016-01-06 DIAGNOSIS — D518 Other vitamin B12 deficiency anemias: Secondary | ICD-10-CM

## 2016-01-06 DIAGNOSIS — M159 Polyosteoarthritis, unspecified: Secondary | ICD-10-CM | POA: Diagnosis not present

## 2016-01-06 DIAGNOSIS — D519 Vitamin B12 deficiency anemia, unspecified: Secondary | ICD-10-CM | POA: Diagnosis not present

## 2016-01-06 DIAGNOSIS — E46 Unspecified protein-calorie malnutrition: Secondary | ICD-10-CM | POA: Diagnosis not present

## 2016-01-06 DIAGNOSIS — C50112 Malignant neoplasm of central portion of left female breast: Secondary | ICD-10-CM | POA: Diagnosis not present

## 2016-01-06 DIAGNOSIS — R2681 Unsteadiness on feet: Secondary | ICD-10-CM | POA: Diagnosis not present

## 2016-01-06 DIAGNOSIS — E559 Vitamin D deficiency, unspecified: Secondary | ICD-10-CM

## 2016-01-06 DIAGNOSIS — R5381 Other malaise: Secondary | ICD-10-CM | POA: Diagnosis not present

## 2016-01-06 DIAGNOSIS — E876 Hypokalemia: Secondary | ICD-10-CM | POA: Diagnosis not present

## 2016-01-06 DIAGNOSIS — M6281 Muscle weakness (generalized): Secondary | ICD-10-CM | POA: Diagnosis not present

## 2016-01-06 DIAGNOSIS — D696 Thrombocytopenia, unspecified: Secondary | ICD-10-CM

## 2016-01-06 DIAGNOSIS — K5900 Constipation, unspecified: Secondary | ICD-10-CM

## 2016-01-06 NOTE — Progress Notes (Signed)
LOCATION: [facility]  PCP: [name], MD   Code Status: DNR  Goals of care: Advanced Directive information Advanced Directives 01/06/2016  Does patient have an advance directive? Yes  Type of Advance Directive Out of facility DNR (pink MOST or yellow form)  Does patient want to make changes to advanced directive? No - Patient declined  Copy of advanced directive(s) in chart? Yes       Extended Emergency Contact Information Primary Emergency Contact: Sickinger,Jeffery Address: [place], [place] Phone: [phone] Relation: Son   No Known Allergies  Chief Complaint  Patient presents with  . New Admit To SNF    New Admission     HPI:  Patient is a 82 y.o. female seen today for short term rehabilitation post hospital admission from 11/16/15-11/18/15 with urinary tract infection and left breast mass for which she had biopsy while in the hospital. She was sent to another SNF for STR. She is here to continue her rehabilitation. She has followed with oncology since that admission and has new diagnosis of cancer to central portion of left breast. She is seen in her room today.   Review of Systems:  Constitutional: Negative for fever, chills, diaphoresis. Feels weak and tired. HENT: Negative for congestion, nasal discharge, hearing loss, sore throat, difficulty swallowing. Positive for some headaches.   Eyes: Negative for blurred vision, double vision and discharge.  Respiratory: Negative for cough, shortness of breath and wheezing.   Cardiovascular: Negative for chest pain, palpitations, leg swelling.  Gastrointestinal: Negative for  nausea, vomiting. Has poor appetite. Positive for heartburn and some abdominal discomfort. Last bowel movement was yesterday.  Genitourinary: Negative for dysuria and flank pain.  Musculoskeletal: Negative for back pain, fall in the facility. Positive for arthritis pain in her  joints.  Skin: Negative for itching, rash.  Neurological: Negative for weakness and dizziness. Psychiatric/Behavioral: Negative for depression    Past Medical History  Diagnosis Date  . Arthritis    Past Surgical History  Procedure Laterality Date  . Abdominal hysterectomy    . Cholecystectomy    . Tonsillectomy    . Appendectomy     Social History:   reports that she has never smoked. She has never used smokeless tobacco. She reports that she does not drink alcohol or use illicit drugs.  Family History  Problem Relation Age of Onset  . Arthritis/Rheumatoid Mother     Medications:   Medication List       This list is accurate as of: 01/06/16  2:04 PM.  Always use your most recent med list.               acetaminophen 650 MG CR tablet  Commonly known as:  TYLENOL  Take 650 mg by mouth every 8 (eight) hours as needed for pain. Reported on 12/22/2015     calcitonin (salmon) 200 UNIT/ACT nasal spray  Commonly known as:  MIACALCIN/FORTICAL  Place 1 spray into alternate nostrils daily.     glucosamine-chondroitin 500-400 MG tablet  Take 1 tablet by mouth 3 (three) times daily.     oxyCODONE-acetaminophen 5-325 MG tablet  Commonly known as:  PERCOCET/ROXICET  Take 1 tablet by mouth every 4 (four) hours as needed for moderate pain.     polyethylene glycol packet  Commonly known as:  MIRALAX / GLYCOLAX  Take 17 g by mouth daily.     senna-docusate 8.6-50  MG tablet  Commonly known as:  Senokot-S  Take 1 tablet by mouth 2 (two) times daily.     traMADol 50 MG tablet  Commonly known as:  ULTRAM  Take 1 tablet (50 mg total) by mouth 2 (two) times daily as needed for moderate pain or severe pain.     UNABLE TO FIND  Med Name: Med pass 120 mL BID between meals     vitamin B-12 100 MCG tablet  Commonly known as:  CYANOCOBALAMIN  Take 100 mcg by mouth daily.     vitamin C 500 MG tablet  Commonly known as:  ASCORBIC ACID  Take 500 mg by mouth daily.     Vitamin  D (Ergocalciferol) 50000 units Caps capsule  Commonly known as:  DRISDOL  Take 50,000 Units by mouth every 7 (seven) days.        Immunizations:  There is no immunization history on file for this patient.   Physical Exam: Filed Vitals:   01/06/16 1356  BP: 136/96  Pulse: 82  Temp: 96.8 F (36 C)  TempSrc: Oral  Resp: 18  Height: 5\' 4"  (1.626 m)  Weight: 96 lb 11.2 oz (43.863 kg)  SpO2: 98%   Body mass index is 16.59 kg/(m^2).  General- elderly female, frail and thin built, in no acute distress Head- normocephalic, atraumatic Nose- normal nasal mucosa, no maxillary or frontal sinus tenderness, no nasal discharge Throat- moist mucus membrane Eyes- PERRLA, EOMI, no pallor, no icterus, no discharge, normal conjunctiva, normal sclera Neck- no cervical lymphadenopathy Cardiovascular- normal s1,s2, no murmur, no leg edema Respiratory- bilateral clear to auscultation, no wheeze, no rhonchi, no crackles, no use of accessory muscles Abdomen- bowel sounds present, soft, non tender Musculoskeletal- able to move all 4 extremities, generalized weakness, arthritis changes to her fingers Neurological- alert and oriented to person, place and time Skin- warm and dry Psychiatry- normal mood and affect    Labs reviewed: Basic Metabolic Panel:  Recent Labs  11/17/15 0418 11/17/15 0907 11/18/15 0410 12/11/15 0037  NA  --  141 140 142  K  --  3.6 3.3* 3.2*  CL  --  109 109 109  CO2  --  28 28 28   GLUCOSE  --  102* 98 101*  BUN  --  23* 19 18  CREATININE  --  0.52 0.43* 0.30*  CALCIUM  --  8.5* 8.3* 8.8*  MG 1.9  --   --   --    Liver Function Tests:  Recent Labs  11/16/15 1659 12/11/15 0037  AST 44* 16  ALT 26 11*  ALKPHOS 52 88  BILITOT 1.2 0.8  PROT 6.5 5.7*  ALBUMIN 3.8 3.4*   No results for input(s): LIPASE, AMYLASE in the last 8760 hours. No results for input(s): AMMONIA in the last 8760 hours. CBC:  Recent Labs  11/16/15 1659 11/17/15 0907  11/18/15 0410 12/11/15 0037  WBC 9.2 7.5 6.6 5.6  NEUTROABS 7.9* 6.1  --  3.6  HGB 13.8 13.7 11.9* 12.7  HCT 42.5 41.0 36.7 39.6  MCV 100.2* 99.3 101.9* 102.9*  PLT 125* 113* 116* 149*   Cardiac Enzymes:  Recent Labs  11/16/15 1659  CKTOTAL 551*   BNP: Invalid input(s): POCBNP CBG: No results for input(s): GLUCAP in the last 8760 hours.   Assessment/Plan  Physical deconditioning With generalized weakness. Will have him work with physical therapy and occupational therapy team to help with gait training and muscle strengthening exercises.fall precautions. Skin care. Encourage to be  out of bed.   Protein calorie malnutrition Monitor po intake. Get RD consult and continue medpass supplement  Hypokalemia Monitor bmp  Thrombocytopenia Monitor platelet count  Left breast cancer To follow with oncology. Plan is for surgery vs palliative treatment with aromatase inhibitor  OA Continue percocet 5-325 mg 1 tab q4h prn pain with tramadol 50 mg bid prn pain  Vitamin d def Continue weekly vitamin d  Constipation Continue daily miralax and senokot s bid  Macrocytic anemia Continue b12 supplement and check cbc    Goals of care: short term rehabilitation   Labs/tests ordered: cbc, cmp  Family/ staff Communication: reviewed care plan with patient and nursing supervisor    Blanchie Serve, MD Internal Medicine [address] [place], [place] [address] Cell Phone (Monday-Friday 8 am - 5 pm): [phone] On Call: [phone] and follow prompts after 5 pm and on weekends Office Phone: [phone] Office Fax: [phone]

## 2016-01-07 DIAGNOSIS — Z79899 Other long term (current) drug therapy: Secondary | ICD-10-CM | POA: Diagnosis not present

## 2016-01-07 DIAGNOSIS — D649 Anemia, unspecified: Secondary | ICD-10-CM | POA: Diagnosis not present

## 2016-01-07 DIAGNOSIS — M6281 Muscle weakness (generalized): Secondary | ICD-10-CM | POA: Diagnosis not present

## 2016-01-07 DIAGNOSIS — R2681 Unsteadiness on feet: Secondary | ICD-10-CM | POA: Diagnosis not present

## 2016-01-07 LAB — CBC AND DIFFERENTIAL
HCT: 40 % (ref 36–46)
Hemoglobin: 12.5 g/dL (ref 12.0–16.0)
Platelets: 107 10*3/uL — AB (ref 150–399)
WBC: 5.1 10^3/mL

## 2016-01-07 LAB — BASIC METABOLIC PANEL
BUN: 20 mg/dL (ref 4–21)
CREATININE: 0.5 mg/dL (ref 0.5–1.1)
GLUCOSE: 97 mg/dL
POTASSIUM: 3.6 mmol/L (ref 3.4–5.3)
SODIUM: 146 mmol/L (ref 137–147)

## 2016-01-07 LAB — HEPATIC FUNCTION PANEL
ALT: 7 U/L (ref 7–35)
AST: 13 U/L (ref 13–35)
Alkaline Phosphatase: 54 U/L (ref 25–125)
Bilirubin, Total: 0.6 mg/dL

## 2016-01-08 DIAGNOSIS — M6281 Muscle weakness (generalized): Secondary | ICD-10-CM | POA: Diagnosis not present

## 2016-01-08 DIAGNOSIS — R2681 Unsteadiness on feet: Secondary | ICD-10-CM | POA: Diagnosis not present

## 2016-01-09 DIAGNOSIS — M6281 Muscle weakness (generalized): Secondary | ICD-10-CM | POA: Diagnosis not present

## 2016-01-09 DIAGNOSIS — R2681 Unsteadiness on feet: Secondary | ICD-10-CM | POA: Diagnosis not present

## 2016-01-12 DIAGNOSIS — M6281 Muscle weakness (generalized): Secondary | ICD-10-CM | POA: Diagnosis not present

## 2016-01-12 DIAGNOSIS — R2681 Unsteadiness on feet: Secondary | ICD-10-CM | POA: Diagnosis not present

## 2016-01-13 DIAGNOSIS — M6281 Muscle weakness (generalized): Secondary | ICD-10-CM | POA: Diagnosis not present

## 2016-01-13 DIAGNOSIS — R2681 Unsteadiness on feet: Secondary | ICD-10-CM | POA: Diagnosis not present

## 2016-01-14 ENCOUNTER — Encounter (HOSPITAL_COMMUNITY)
Admission: RE | Admit: 2016-01-14 | Discharge: 2016-01-14 | Disposition: A | Discharge status: Home / Self Care | Payer: Medicare Other | Source: Ambulatory Visit | Attending: General Surgery | Admitting: General Surgery

## 2016-01-14 ENCOUNTER — Other Ambulatory Visit (HOSPITAL_COMMUNITY): Payer: Self-pay

## 2016-01-14 ENCOUNTER — Encounter (HOSPITAL_COMMUNITY): Payer: Self-pay

## 2016-01-14 DIAGNOSIS — C50912 Malignant neoplasm of unspecified site of left female breast: Secondary | ICD-10-CM | POA: Diagnosis not present

## 2016-01-14 DIAGNOSIS — Z01818 Encounter for other preprocedural examination: Secondary | ICD-10-CM | POA: Diagnosis not present

## 2016-01-14 DIAGNOSIS — M6281 Muscle weakness (generalized): Secondary | ICD-10-CM | POA: Diagnosis not present

## 2016-01-14 DIAGNOSIS — R2681 Unsteadiness on feet: Secondary | ICD-10-CM | POA: Diagnosis not present

## 2016-01-14 HISTORY — DX: Other complications of anesthesia, initial encounter: T88.59XA

## 2016-01-14 HISTORY — DX: Adverse effect of unspecified anesthetic, initial encounter: T41.45XA

## 2016-01-14 HISTORY — DX: Other specified postprocedural states: R11.2

## 2016-01-14 HISTORY — DX: Other specified postprocedural states: Z98.890

## 2016-01-14 HISTORY — DX: Personal history of other diseases of the digestive system: Z87.19

## 2016-01-14 HISTORY — DX: Unspecified fall, initial encounter: W19.XXXA

## 2016-01-14 NOTE — Progress Notes (Addendum)
PCP - Deland Pretty - requesting records Cardiologist - denies  Chest x-ray - 2014 EKG - 12/11/15 Stress Test - denies ECHO - denies Cardiac Cath - denies  Sending to anesthesia for review  Patient denies shortness of breath, fever, cough and chest pain at PAT appointment

## 2016-01-14 NOTE — Pre-Procedure Instructions (Signed)
[place]  01/14/2016      [place], [place] [address] Phone: [phone] Fax: [phone]    Your procedure is scheduled on July 26  Report to [facility] Admitting at 0800 A.M.  Call this number if you have problems the morning of surgery:  [phone]   Remember:  Do not eat food or drink liquids after midnight.   Take these medicines the morning of surgery with A SIP OF WATER  percocet if needed, tramadol if needed,    Do not wear jewelry, make-up or nail polish.  Do not wear lotions, powders, or perfumes.  You may NOT wear deoderant.  Do not shave 48 hours prior to surgery.   Do not bring valuables to the hospital.  [facility] is not responsible for any belongings or valuables.  Contacts, dentures or bridgework may not be worn into surgery.  Leave your suitcase in the car.  After surgery it may be brought to your room.  For patients admitted to the hospital, discharge time will be determined by your treatment team.  Patients discharged the day of surgery will not be allowed to drive home.    Special instructions:   [facility]- Preparing For Surgery  Before surgery, you can play an important role. Because skin is not sterile, your skin needs to be as free of germs as possible. You can reduce the number of germs on your skin by washing with CHG (chlorahexidine gluconate) Soap before surgery.  CHG is an antiseptic cleaner which kills germs and bonds with the skin to continue killing germs even after washing.  Please do not use if you have an allergy to CHG or antibacterial soaps. If your skin becomes reddened/irritated stop using the CHG.  Do not shave (including legs and underarms) for at least 48 hours prior to first CHG shower. It is OK to shave your face.  Please follow these instructions carefully.   1. Shower the NIGHT BEFORE SURGERY and the MORNING OF  SURGERY with CHG.   2. If you chose to wash your hair, wash your hair first as usual with your normal shampoo.  3. After you shampoo, rinse your hair and body thoroughly to remove the shampoo.  4. Use CHG as you would any other liquid soap. You can apply CHG directly to the skin and wash gently with a scrungie or a clean washcloth.   5. Apply the CHG Soap to your body ONLY FROM THE NECK DOWN.  Do not use on open wounds or open sores. Avoid contact with your eyes, ears, mouth and genitals (private parts). Wash genitals (private parts) with your normal soap.  6. Wash thoroughly, paying special attention to the area where your surgery will be performed.  7. Thoroughly rinse your body with warm water from the neck down.  8. DO NOT shower/wash with your normal soap after using and rinsing off the CHG Soap.  9. Pat yourself dry with a CLEAN TOWEL.   10. Wear CLEAN PAJAMAS   11. Place CLEAN SHEETS on your bed the night of your first shower and DO NOT SLEEP WITH PETS.    Day of Surgery: Do not apply any deodorants/lotions. Please wear clean clothes to the hospital/surgery center.      Please read over the following fact sheets that you were given. Pain Booklet and Coughing and Deep Breathing

## 2016-01-15 ENCOUNTER — Other Ambulatory Visit: Payer: Self-pay

## 2016-01-15 DIAGNOSIS — R2681 Unsteadiness on feet: Secondary | ICD-10-CM | POA: Diagnosis not present

## 2016-01-15 DIAGNOSIS — M6281 Muscle weakness (generalized): Secondary | ICD-10-CM | POA: Diagnosis not present

## 2016-01-15 MED ORDER — OXYCODONE-ACETAMINOPHEN 5-325 MG PO TABS: 1.0000 | ORAL_TABLET | ORAL | Status: DC | PRN

## 2016-01-15 NOTE — Telephone Encounter (Signed)
Rx faxed to [facility] @ [phone], phone number [phone]  

## 2016-01-15 NOTE — Progress Notes (Signed)
Anesthesia Chart Review: Patient is a 82 year old female scheduled for left total mastectomy on 01/21/16 by Dr. Excell Seltzer. She was recently diagnosed with left breast cancer. Given her advance age, she was not felt to be a candidate for adjuvant chemotherapy. She has opted for surgery instead of palliative treatment with aromatase inhibitor.  History includes left breast cancer, non-smoker, post-operative N/V, arthritis, hiatal hernia, hysterectomy, cholecystectomy appendectomy, tonsillectomy. 12/11/15 ED visit (sent from [facility]) for vaginal bleeding but some blood actually noted in the rectal wall felt most consistent with mild colitis. H/H were stable, orthostatics were negative so she was discharged on Cipro and Flagyl and recommendations for out-patient referral to GI. CSW also consulted to request transfer to another SNF (now at [facility]).  PCP is Dr. Deland Pretty. Newly admit to [facility]) on 01/06/16 (see note by Dr. Bubba Camp). She was admitted for short term rehabilitation post-hospitalization from 11/16/15-11/18/15 for fall, UTI with left breast mass (s/p biopsy 12/05/15 revealing invasive mammary carcinoma). HEM-ONC is Dr. Burr Medico.  Meds include calcitonin, Percocet, tramadol, vitamins B12, C, and D.  PAT Vitals: BP 145/78, HR 85, RR 18, T 36.3, O2 sat 96%. BMI 14.69.  12/11/15 EKG (done in ED during evaluation for "vaginal" bleeding; treated for mild colitis): SR at 65 bpm, occasional PVC, multiple PAC's (two), non-specific lateral T wave abnormality, minimal inferior ST elevation (baseline wanderer may be effecting result), when compared to 11/16/15 PACs/PVCs new. 11/16/15 tracing showed SR, probable LAE. Small q waves in high lateral leads has been present since at least 12/17/12.   01/07/16 preoperative labs noted (on chart from [facility]). These showed a Na 146, K 3.6, Cl 110, glucose 97, Cr 0.45, Ca 9.0, total protein 4.9, albumin 3.24, AST 13, ALT 7, WBC  5.1, H/H 12.5/39.8, PLT 107K (comparison labs show a PLT count 113-149K since 11/16/15).  With the exception of occasional PVC/PACs on EKG, I think it appears stable for at least three years. Labs appear acceptable for OR. Further evaluation on the day of surgery by her anesthesiologist and surgeon to ensure no acute changes prior to proceeding.  George Hugh [facility]/Anesthesiology Phone [phone] 01/15/2016 12:31 PM

## 2016-01-16 DIAGNOSIS — M6281 Muscle weakness (generalized): Secondary | ICD-10-CM | POA: Diagnosis not present

## 2016-01-16 DIAGNOSIS — R2681 Unsteadiness on feet: Secondary | ICD-10-CM | POA: Diagnosis not present

## 2016-01-20 MED ORDER — CEFAZOLIN SODIUM-DEXTROSE 2-4 GM/100ML-% IV SOLN
2.0000 g | INTRAVENOUS | Status: AC
  Administered 2016-01-21: 2 g via INTRAVENOUS
  Filled 2016-01-20: qty 100

## 2016-01-20 NOTE — H&P (Signed)
History of Present Illness The patient is a 82 year old female who presents with breast cancer. Patient is a 82 year old female who presents with a new diagnosis of cancer of the left breast. She is accompanied today by her son. She had been living at home relatively independently until just recently. She sustained a fall and was admitted to [facility] long hospital. There were no fractures or major injuries but she had some bruising and pain and also was found to have a significant UTI which required in-hospital treatment. While in the hospital she was found to have a large left breast mass. She was subsequently discharged to a nursing facility and was referred to the breast center for evaluation.  Mammogram revealed a large irregular mass in the upper breast at the 11 and 12 o'clock position measuring about 5 cm with overlying skin involvement and calcifications. Targeted ultrasound showed a mass at the 10 o'clock position 3 cm from the nipple measuring 4.1 cm. Axilla was negative by ultrasound. Ultrasound-guided core biopsy was performed. This has revealed a grade 1-2 invasive mammary carcinoma. E-cadherin stain is pending as well as the breast prognostic profile.  The patient mainly complains about having to be in a nursing home. She is very pleasant and conversant. Her son states that she has had some mental deterioration not severe. She states that she had noted a lump in her breast but thought it was benign. She has some pain in her tailbone from her fall but no other pain. She does have poor appetite and has been losing some weight.   Allergies  No Known Drug Allergies  Medication History  TraMADol HCl (50MG  Tablet, Oral) Active. Lovenox (120MG /0.8ML Solution, Subcutaneous) Active. Medications Reconciled  Vitals   Weight: 92 lb Height: 64in Body Surface Area: 1.41 m Body Mass Index: 15.79 kg/m  Temp.: 52F(Temporal)  Pulse: 76 (Regular)  BP: 124/80  (Sitting, Left Arm, Standard)       Physical Exam The physical exam findings are as follows: Note:General: Alert, thin elderly Caucasian female, conversant, no distress, examined in the wheelchair. Significant kyphosis. Skin: Warm and dry without rash or infection. HEENT: No palpable masses or thyromegaly. Sclera nonicteric. Pupils equal round and reactive. Lymph nodes: No cervical, supraclavicular, or axillary nodes palpable. Breasts: Atrophic, ptotic breasts bilaterally. There is a large approximately 5 cm freely movable mass in the upper outer left breast with an approximately 2-1/2 cm area of overlying skin breakdown and open wound. No evidence of infection. Lungs: Breath sounds are distant without wheezing or increased work of breathing. Cardiovascular: Regular rate and rhythm without murmer. Abdomen: Nondistended. Soft and nontender. No masses palpable. No organomegaly. No palpable hernias. Extremities: No significant edema. Neurologic: Alert and fully oriented. Psychiatric: Normal mood and affect. She appears to completely understand our discussion today.    Assessment & Plan  BREAST CANCER, LEFT (C50.912) Impression: Cancer upper-outer left breast, stage IIa T2 N0 clinically. Mammary cancer not further defined awaiting stains and prognostic panel pending. I discussed the findings in detail with the patient and her son. I discussed with them I thought she has 3 options at her age which would include doing nothing with continued wound care, simple mastectomy or hormonal therapy. She does not have any major systemic illness and it is hard to know with no treatment if her fungating cancer could become a very unpleasant problem for her. I would therefore suggest that doing nothing is probably not the best course. She had already felt that way. She  has very small ptotic breasts and a simple mastectomy would actually be minimal surgery. She has seen medical oncology and after consideration  and discussion with her son has decided to proceed with mastectomy. All of her and her son's questions were answered.

## 2016-01-21 ENCOUNTER — Encounter (HOSPITAL_COMMUNITY): Payer: Self-pay | Admitting: *Deleted

## 2016-01-21 ENCOUNTER — Observation Stay (HOSPITAL_COMMUNITY)
Admission: RE | Admit: 2016-01-21 | Discharge: 2016-01-22 | Disposition: A | Discharge status: Skilled Nursing Facility | Payer: Medicare Other | Source: Ambulatory Visit | Attending: General Surgery | Admitting: General Surgery

## 2016-01-21 ENCOUNTER — Ambulatory Visit (HOSPITAL_COMMUNITY): Payer: Medicare Other | Admitting: Anesthesiology

## 2016-01-21 ENCOUNTER — Encounter (HOSPITAL_COMMUNITY)
Admission: RE | Disposition: A | Discharge status: Skilled Nursing Facility | Payer: Self-pay | Source: Ambulatory Visit | Attending: General Surgery

## 2016-01-21 ENCOUNTER — Ambulatory Visit (HOSPITAL_COMMUNITY): Payer: Medicare Other | Admitting: Vascular Surgery

## 2016-01-21 DIAGNOSIS — C779 Secondary and unspecified malignant neoplasm of lymph node, unspecified: Secondary | ICD-10-CM | POA: Diagnosis not present

## 2016-01-21 DIAGNOSIS — C50412 Malignant neoplasm of upper-outer quadrant of left female breast: Secondary | ICD-10-CM | POA: Diagnosis not present

## 2016-01-21 DIAGNOSIS — G8918 Other acute postprocedural pain: Secondary | ICD-10-CM | POA: Diagnosis not present

## 2016-01-21 DIAGNOSIS — M1990 Unspecified osteoarthritis, unspecified site: Secondary | ICD-10-CM | POA: Diagnosis not present

## 2016-01-21 DIAGNOSIS — C773 Secondary and unspecified malignant neoplasm of axilla and upper limb lymph nodes: Secondary | ICD-10-CM | POA: Insufficient documentation

## 2016-01-21 DIAGNOSIS — Z170 Estrogen receptor positive status [ER+]: Secondary | ICD-10-CM | POA: Insufficient documentation

## 2016-01-21 DIAGNOSIS — C50912 Malignant neoplasm of unspecified site of left female breast: Secondary | ICD-10-CM

## 2016-01-21 HISTORY — PX: MASTECTOMY COMPLETE / SIMPLE: SUR845

## 2016-01-21 HISTORY — PX: TOTAL MASTECTOMY: SHX6129

## 2016-01-21 HISTORY — DX: Unspecified protein-calorie malnutrition: E46

## 2016-01-21 HISTORY — DX: Malignant neoplasm of unspecified site of left female breast: C50.912

## 2016-01-21 LAB — CBC
HCT: 41.8 % (ref 36.0–46.0)
Hemoglobin: 13.1 g/dL (ref 12.0–15.0)
MCH: 31.8 pg (ref 26.0–34.0)
MCHC: 31.3 g/dL (ref 30.0–36.0)
MCV: 101.5 fL — AB (ref 78.0–100.0)
PLATELETS: 191 10*3/uL (ref 150–400)
RBC: 4.12 MIL/uL (ref 3.87–5.11)
RDW: 14.1 % (ref 11.5–15.5)
WBC: 9.9 10*3/uL (ref 4.0–10.5)

## 2016-01-21 LAB — CREATININE, SERUM: Creatinine, Ser: 0.43 mg/dL — ABNORMAL LOW (ref 0.44–1.00)

## 2016-01-21 SURGERY — MASTECTOMY, SIMPLE
Anesthesia: Regional | Site: Breast | Laterality: Left

## 2016-01-21 MED ORDER — CHLORHEXIDINE GLUCONATE CLOTH 2 % EX PADS: 6.0000 | MEDICATED_PAD | Freq: Once | CUTANEOUS | Status: DC

## 2016-01-21 MED ORDER — OXYCODONE-ACETAMINOPHEN 5-325 MG PO TABS: 1.0000 | ORAL_TABLET | ORAL | Status: DC | PRN

## 2016-01-21 MED ORDER — ENOXAPARIN SODIUM 30 MG/0.3ML ~~LOC~~ SOLN
30.0000 mg | SUBCUTANEOUS | Status: DC
  Administered 2016-01-22: 30 mg via SUBCUTANEOUS
  Filled 2016-01-21: qty 0.3

## 2016-01-21 MED ORDER — HYDROMORPHONE HCL 1 MG/ML IJ SOLN
INTRAMUSCULAR | Status: AC
Start: 2016-01-21 — End: 2016-01-22
  Filled 2016-01-21: qty 1

## 2016-01-21 MED ORDER — HYDRALAZINE HCL 20 MG/ML IJ SOLN
INTRAMUSCULAR | Status: AC
  Filled 2016-01-21: qty 1

## 2016-01-21 MED ORDER — ONDANSETRON HCL 4 MG/2ML IJ SOLN: 4.0000 mg | Freq: Four times a day (QID) | INTRAMUSCULAR | Status: DC | PRN

## 2016-01-21 MED ORDER — SENNOSIDES-DOCUSATE SODIUM 8.6-50 MG PO TABS
1.0000 | ORAL_TABLET | Freq: Two times a day (BID) | ORAL | Status: DC
  Filled 2016-01-21: qty 1

## 2016-01-21 MED ORDER — PROPOFOL 10 MG/ML IV BOLUS
INTRAVENOUS | Status: AC
  Filled 2016-01-21: qty 20

## 2016-01-21 MED ORDER — 0.9 % SODIUM CHLORIDE (POUR BTL) OPTIME
TOPICAL | Status: DC | PRN
  Administered 2016-01-21: 1000 mL

## 2016-01-21 MED ORDER — METOPROLOL TARTRATE 5 MG/5ML IV SOLN
INTRAVENOUS | Status: AC
  Filled 2016-01-21: qty 5

## 2016-01-21 MED ORDER — BUPIVACAINE-EPINEPHRINE (PF) 0.5% -1:200000 IJ SOLN
INTRAMUSCULAR | Status: DC | PRN
  Administered 2016-01-21: 25 mL

## 2016-01-21 MED ORDER — ONDANSETRON HCL 4 MG/2ML IJ SOLN
INTRAMUSCULAR | Status: DC | PRN
  Administered 2016-01-21: 4 mg via INTRAVENOUS

## 2016-01-21 MED ORDER — LACTATED RINGERS IV SOLN
INTRAVENOUS | Status: DC
  Administered 2016-01-21: 10:00:00 via INTRAVENOUS

## 2016-01-21 MED ORDER — MIDAZOLAM HCL 2 MG/2ML IJ SOLN
INTRAMUSCULAR | Status: AC
  Filled 2016-01-21: qty 2

## 2016-01-21 MED ORDER — FENTANYL CITRATE (PF) 100 MCG/2ML IJ SOLN
INTRAMUSCULAR | Status: AC
  Administered 2016-01-21: 25 ug
  Filled 2016-01-21: qty 2

## 2016-01-21 MED ORDER — HYDROMORPHONE HCL 1 MG/ML IJ SOLN
INTRAMUSCULAR | Status: AC
  Filled 2016-01-21: qty 1

## 2016-01-21 MED ORDER — EPHEDRINE SULFATE 50 MG/ML IJ SOLN
INTRAMUSCULAR | Status: DC | PRN
  Administered 2016-01-21: 5 mg via INTRAVENOUS

## 2016-01-21 MED ORDER — POTASSIUM CHLORIDE IN NACL 20-0.9 MEQ/L-% IV SOLN
INTRAVENOUS | Status: DC
  Administered 2016-01-21: 16:00:00 via INTRAVENOUS
  Filled 2016-01-21: qty 1000

## 2016-01-21 MED ORDER — HYDRALAZINE HCL 20 MG/ML IJ SOLN
2.5000 mg | Freq: Four times a day (QID) | INTRAMUSCULAR | Status: DC | PRN
  Administered 2016-01-21: 2.5 mg via INTRAVENOUS

## 2016-01-21 MED ORDER — FENTANYL CITRATE (PF) 250 MCG/5ML IJ SOLN
INTRAMUSCULAR | Status: AC
  Filled 2016-01-21: qty 5

## 2016-01-21 MED ORDER — ONDANSETRON 4 MG PO TBDP: 4.0000 mg | ORAL_TABLET | Freq: Four times a day (QID) | ORAL | Status: DC | PRN

## 2016-01-21 MED ORDER — FENTANYL CITRATE (PF) 100 MCG/2ML IJ SOLN
INTRAMUSCULAR | Status: DC | PRN
  Administered 2016-01-21: 50 ug via INTRAVENOUS

## 2016-01-21 MED ORDER — MORPHINE SULFATE (PF) 2 MG/ML IV SOLN: 1.0000 mg | INTRAVENOUS | Status: DC | PRN

## 2016-01-21 MED ORDER — HYDROMORPHONE HCL 1 MG/ML IJ SOLN
0.2500 mg | INTRAMUSCULAR | Status: DC | PRN
  Administered 2016-01-21 (×2): 0.5 mg via INTRAVENOUS
  Administered 2016-01-21 (×2): 0.25 mg via INTRAVENOUS

## 2016-01-21 MED ORDER — POLYETHYLENE GLYCOL 3350 17 G PO PACK: 17.0000 g | PACK | Freq: Every day | ORAL | Status: DC

## 2016-01-21 MED ORDER — DEXAMETHASONE SODIUM PHOSPHATE 4 MG/ML IJ SOLN
INTRAMUSCULAR | Status: DC | PRN
  Administered 2016-01-21: 6 mg via INTRAVENOUS

## 2016-01-21 MED ORDER — METOPROLOL TARTRATE 5 MG/5ML IV SOLN
5.0000 mg | Freq: Four times a day (QID) | INTRAVENOUS | Status: DC | PRN
  Administered 2016-01-21: 5 mg via INTRAVENOUS

## 2016-01-21 MED ORDER — PROPOFOL 10 MG/ML IV BOLUS
INTRAVENOUS | Status: DC | PRN
  Administered 2016-01-21: 50 mg via INTRAVENOUS

## 2016-01-21 MED ORDER — TRAMADOL HCL 50 MG PO TABS: 50.0000 mg | ORAL_TABLET | Freq: Two times a day (BID) | ORAL | Status: DC | PRN

## 2016-01-21 MED ORDER — LIDOCAINE HCL (CARDIAC) 20 MG/ML IV SOLN
INTRAVENOUS | Status: DC | PRN
  Administered 2016-01-21: 40 mg via INTRAVENOUS

## 2016-01-21 SURGICAL SUPPLY — 47 items
BINDER BREAST LRG (GAUZE/BANDAGES/DRESSINGS) IMPLANT
BINDER BREAST MEDIUM (GAUZE/BANDAGES/DRESSINGS) ×3 IMPLANT
BINDER BREAST XLRG (GAUZE/BANDAGES/DRESSINGS) IMPLANT
BIOPATCH RED 1 DISK 7.0 (GAUZE/BANDAGES/DRESSINGS) IMPLANT
BIOPATCH RED 1IN DISK 7.0MM (GAUZE/BANDAGES/DRESSINGS)
CANISTER SUCTION 2500CC (MISCELLANEOUS) ×6 IMPLANT
CHLORAPREP W/TINT 26ML (MISCELLANEOUS) IMPLANT
CLIP TI MEDIUM 6 (CLIP) ×3 IMPLANT
COVER SURGICAL LIGHT HANDLE (MISCELLANEOUS) ×3 IMPLANT
DEVICE DISSECT PLASMABLAD 3.0S (MISCELLANEOUS) ×1 IMPLANT
DRAIN CHANNEL 19F RND (DRAIN) ×3 IMPLANT
DRAPE CHEST BREAST 15X10 FENES (DRAPES) ×3 IMPLANT
DRAPE SURG 17X23 STRL (DRAPES) ×3 IMPLANT
DRAPE UTILITY XL STRL (DRAPES) ×6 IMPLANT
DRSG ADAPTIC 3X8 NADH LF (GAUZE/BANDAGES/DRESSINGS) ×3 IMPLANT
DRSG PAD ABDOMINAL 8X10 ST (GAUZE/BANDAGES/DRESSINGS) ×3 IMPLANT
ELECT CAUTERY BLADE 6.4 (BLADE) ×3 IMPLANT
ELECT REM PT RETURN 9FT ADLT (ELECTROSURGICAL) ×6
ELECTRODE REM PT RTRN 9FT ADLT (ELECTROSURGICAL) ×2 IMPLANT
EVACUATOR SILICONE 100CC (DRAIN) ×3 IMPLANT
GAUZE SPONGE 4X4 12PLY STRL (GAUZE/BANDAGES/DRESSINGS) ×3 IMPLANT
GLOVE BIOGEL PI IND STRL 8 (GLOVE) ×1 IMPLANT
GLOVE BIOGEL PI INDICATOR 8 (GLOVE) ×2
GLOVE ECLIPSE 7.5 STRL STRAW (GLOVE) ×6 IMPLANT
GLOVE SURG SS PI 6.5 STRL IVOR (GLOVE) ×3 IMPLANT
GOWN STRL REUS W/ TWL LRG LVL3 (GOWN DISPOSABLE) IMPLANT
GOWN STRL REUS W/ TWL XL LVL3 (GOWN DISPOSABLE) ×2 IMPLANT
GOWN STRL REUS W/TWL LRG LVL3 (GOWN DISPOSABLE)
GOWN STRL REUS W/TWL XL LVL3 (GOWN DISPOSABLE) ×6
KIT BASIN OR (CUSTOM PROCEDURE TRAY) ×3 IMPLANT
KIT ROOM TURNOVER OR (KITS) ×3 IMPLANT
LIQUID BAND (GAUZE/BANDAGES/DRESSINGS) ×3 IMPLANT
NS IRRIG 1000ML POUR BTL (IV SOLUTION) ×3 IMPLANT
PACK GENERAL/GYN (CUSTOM PROCEDURE TRAY) ×3 IMPLANT
PAD ARMBOARD 7.5X6 YLW CONV (MISCELLANEOUS) ×3 IMPLANT
PLASMABLADE 3.0S (MISCELLANEOUS) ×3
SPECIMEN JAR X LARGE (MISCELLANEOUS) ×3 IMPLANT
STAPLER VISISTAT 35W (STAPLE) ×3 IMPLANT
SUT ETHILON 2 0 FS 18 (SUTURE) ×3 IMPLANT
SUT MON AB 5-0 PS2 18 (SUTURE) ×3 IMPLANT
SUT VIC AB 3-0 54X BRD REEL (SUTURE) ×1 IMPLANT
SUT VIC AB 3-0 BRD 54 (SUTURE) ×3
SUT VIC AB 3-0 SH 18 (SUTURE) ×6 IMPLANT
TOWEL OR 17X24 6PK STRL BLUE (TOWEL DISPOSABLE) ×3 IMPLANT
TOWEL OR 17X26 10 PK STRL BLUE (TOWEL DISPOSABLE) ×3 IMPLANT
TUBE CONNECTING 12'X1/4 (SUCTIONS) ×1
TUBE CONNECTING 12X1/4 (SUCTIONS) ×2 IMPLANT

## 2016-01-21 NOTE — Transfer of Care (Signed)
Immediate Anesthesia Transfer of Care Note  Patient: Brooke Perkins  Procedure(s) Performed: Procedure(s): LEFT TOTAL MASTECTOMY (Left)  Patient Location: PACU  Anesthesia Type:General and GA combined with regional for post-op pain  Level of Consciousness: awake, alert , oriented and patient cooperative  Airway & Oxygen Therapy: Patient Spontanous Breathing and Patient connected to nasal cannula oxygen  Post-op Assessment: Report given to RN, Post -op Vital signs reviewed and stable and Patient moving all extremities  Post vital signs: Reviewed and stable  Last Vitals:  Vitals:   01/21/16 0940 01/21/16 1200  BP: (!) 230/89   Pulse:    Resp:    Temp:  36.4 C    Last Pain:  Vitals:   01/21/16 1200  TempSrc:   PainSc: 0-No pain      Patients Stated Pain Goal: 3 ([id number])  Complications: No apparent anesthesia complications

## 2016-01-21 NOTE — Anesthesia Procedure Notes (Signed)
Anesthesia Regional Block:  Pectoralis block  Pre-Anesthetic Checklist: ,, timeout performed, Correct Patient, Correct Site, Correct Laterality, Correct Procedure, Correct Position, site marked, Risks and benefits discussed, pre-op evaluation, post-op pain management  Laterality: Left  Prep: Maximum Sterile Barrier Precautions used, chloraprep       Needles:  Injection technique: Single-shot  Needle Type: Echogenic Stimulator Needle     Needle Length: 9cm 9 cm Needle Gauge: 21 and 21 G    Additional Needles:  Procedures: ultrasound guided (picture in chart) Pectoralis block Narrative:  Start time: 01/21/2016 9:30 AM End time: 01/21/2016 9:40 AM Injection made incrementally with aspirations every 5 mL.  Performed by: Personally  Anesthesiologist: Roderic Palau  Additional Notes: 2% Lidocaine skin wheel.

## 2016-01-21 NOTE — Op Note (Signed)
Preoperative Diagnosis: LEFT BREAST CANCER  Postoprative Diagnosis: LEFT BREAST CANCER  Procedure: Procedure(s): LEFT TOTAL MASTECTOMY   Surgeon: Excell Seltzer T   Assistants: None  Anesthesia:  General LMA anesthesia  Indications: Patient is a 82 year old female in relatively good health for age with no major medical issues who presents with a large left breast mass, biopsy-proven invasive ductal carcinoma with erosion through the skin and a central ulceration lateral to the nipple measuring about 4 cm in diameter. The mass is greater than 5 cm on ultrasound. After extensive preoperative discussion and consultation with the patient and her family and medical oncology we have elected to proceed with left total mastectomy.    Procedure Detail:  Patient was brought to the operating room, placed in the supine position on the operating table, and laryngeal mask general anesthesia induced. She was positioned with her left arm carefully extended and the entire left anterior chest and breast and axilla and upper arm were prepped with Betadine. She received preoperative IV antibiotics. PAS port in place. Patient timeout was performed and correct procedure verified. I planned an elliptical excision oriented somewhat obliquely across the breast and taking a large portion of the breast skin due to the size of the tumor and central erosion and skin breakdown. Incision was made with the plasma blade and then short flaps were created down to the chest wall superiorly and inferiorly and laterally out to the anterior border of latissimus. The breast was then reflected up off the chest wall with the plasma blade dissecting it away from the pectoralis major and serratus and off the anterior border of the latissimus inferiorly. The clavipectoral fascia was incised and the pectoralis minor identified and retracted medially and then I came across the low axilla with hemostats and the specimen was removed. There was  no palpable axillary adenopathy. Axillary tissue was tied with 3-0 Vicryl. The wound was irrigated and complete hemostasis assured. A 19 Blake drain was left through separate stab wound through the axilla and underneath the flaps. The subtenon's tissue was closed with interrupted 3-0 Vicryl and the skin with staples. Sponge needle and instrument counts were correct.    Findings: As above  Estimated Blood Loss:  Minimal         Drains: 19 round Blake drain beneath skin flaps  Blood Given: none          Specimens: Left total mastectomy        Complications:  * No complications entered in OR log *         Disposition: PACU - hemodynamically stable.         Condition: stable

## 2016-01-21 NOTE — Interval H&P Note (Signed)
History and Physical Interval Note:  01/21/2016 10:24 AM  Brooke Perkins  has presented today for surgery, with the diagnosis of LEFT BREAST CANCER  The various methods of treatment have been discussed with the patient and family. After consideration of risks, benefits and other options for treatment, the patient has consented to  Procedure(s): LEFT TOTAL MASTECTOMY (Left) as a surgical intervention .  The patient's history has been reviewed, patient examined, no change in status, stable for surgery.  I have reviewed the patient's chart and labs.  Questions were answered to the patient's satisfaction.     Brooke Perkins

## 2016-01-21 NOTE — Anesthesia Procedure Notes (Signed)
Procedure Name: LMA Insertion Date/Time: 01/21/2016 10:51 AM Performed by: Greggory Stallion, Katniss Weedman L Pre-anesthesia Checklist: Patient identified, Emergency Drugs available, Suction available and Patient being monitored Patient Re-evaluated:Patient Re-evaluated prior to inductionOxygen Delivery Method: Circle System Utilized Preoxygenation: Pre-oxygenation with 100% oxygen Intubation Type: IV induction Ventilation: Mask ventilation without difficulty LMA: LMA inserted LMA Size: 4.0 Number of attempts: 1 Placement Confirmation: positive ETCO2 Tube secured with: Tape Dental Injury: Teeth and Oropharynx as per pre-operative assessment

## 2016-01-21 NOTE — Anesthesia Postprocedure Evaluation (Signed)
Anesthesia Post Note  Patient: Brooke Perkins  Procedure(s) Performed: Procedure(s) (LRB): LEFT TOTAL MASTECTOMY (Left)  Patient location during evaluation: PACU Anesthesia Type: General Level of consciousness: awake Pain management: pain level controlled Vital Signs Assessment: post-procedure vital signs reviewed and stable Respiratory status: spontaneous breathing Cardiovascular status: stable Anesthetic complications: no    Last Vitals:  Vitals:   01/21/16 1400 01/21/16 1415  BP: (!) 194/75 (!) 196/77  Pulse: 67 64  Resp: 13 (!) 22  Temp:      Last Pain:  Vitals:   01/21/16 1400  TempSrc:   PainSc: 3                  EDWARDS,Veria Stradley

## 2016-01-21 NOTE — Anesthesia Preprocedure Evaluation (Addendum)
Anesthesia Evaluation  Patient identified by MRN, date of birth, ID band Patient awake    Reviewed: Allergy & Precautions, H&P , NPO status , Patient's Chart, lab work & pertinent test results  History of Anesthesia Complications (+) PONV  Airway Mallampati: II  TM Distance: >3 FB Neck ROM: Full    Dental no notable dental hx. (+) Teeth Intact, Dental Advisory Given   Pulmonary neg pulmonary ROS,    Pulmonary exam normal breath sounds clear to auscultation       Cardiovascular negative cardio ROS   Rhythm:Regular Rate:Normal     Neuro/Psych negative neurological ROS  negative psych ROS   GI/Hepatic negative GI ROS, Neg liver ROS,   Endo/Other  negative endocrine ROS  Renal/GU negative Renal ROS  negative genitourinary   Musculoskeletal  (+) Arthritis , Osteoarthritis,    Abdominal   Peds  Hematology negative hematology ROS (+)   Anesthesia Other Findings   Reproductive/Obstetrics negative OB ROS                            Anesthesia Physical Anesthesia Plan  ASA: II  Anesthesia Plan: General and Regional   Post-op Pain Management: GA combined w/ Regional for post-op pain   Induction: Intravenous  Airway Management Planned: LMA  Additional Equipment:   Intra-op Plan:   Post-operative Plan: Extubation in OR  Informed Consent: I have reviewed the patients History and Physical, chart, labs and discussed the procedure including the risks, benefits and alternatives for the proposed anesthesia with the patient or authorized representative who has indicated his/her understanding and acceptance.   Dental advisory given  Plan Discussed with: CRNA  Anesthesia Plan Comments:         Anesthesia Quick Evaluation

## 2016-01-22 ENCOUNTER — Encounter (HOSPITAL_COMMUNITY): Payer: Self-pay | Admitting: General Surgery

## 2016-01-22 DIAGNOSIS — N63 Unspecified lump in breast: Secondary | ICD-10-CM | POA: Diagnosis not present

## 2016-01-22 DIAGNOSIS — Z170 Estrogen receptor positive status [ER+]: Secondary | ICD-10-CM | POA: Diagnosis not present

## 2016-01-22 DIAGNOSIS — C773 Secondary and unspecified malignant neoplasm of axilla and upper limb lymph nodes: Secondary | ICD-10-CM | POA: Diagnosis not present

## 2016-01-22 DIAGNOSIS — C50412 Malignant neoplasm of upper-outer quadrant of left female breast: Secondary | ICD-10-CM | POA: Diagnosis not present

## 2016-01-22 DIAGNOSIS — M1990 Unspecified osteoarthritis, unspecified site: Secondary | ICD-10-CM | POA: Diagnosis not present

## 2016-01-22 LAB — CBC
HEMATOCRIT: 34.1 % — AB (ref 36.0–46.0)
HEMOGLOBIN: 11 g/dL — AB (ref 12.0–15.0)
MCH: 32 pg (ref 26.0–34.0)
MCHC: 32.3 g/dL (ref 30.0–36.0)
MCV: 99.1 fL (ref 78.0–100.0)
Platelets: 179 10*3/uL (ref 150–400)
RBC: 3.44 MIL/uL — ABNORMAL LOW (ref 3.87–5.11)
RDW: 14.1 % (ref 11.5–15.5)
WBC: 7.5 10*3/uL (ref 4.0–10.5)

## 2016-01-22 LAB — BASIC METABOLIC PANEL
ANION GAP: 6 (ref 5–15)
BUN: 19 mg/dL (ref 6–20)
CHLORIDE: 104 mmol/L (ref 101–111)
CO2: 30 mmol/L (ref 22–32)
CREATININE: 0.49 mg/dL (ref 0.44–1.00)
Calcium: 9.1 mg/dL (ref 8.9–10.3)
GFR calc non Af Amer: >60 mL/min (ref >60)
Glucose, Bld: 99 mg/dL (ref 65–99)
POTASSIUM: 3.6 mmol/L (ref 3.5–5.1)
Sodium: 140 mmol/L (ref 135–145)

## 2016-01-22 NOTE — Progress Notes (Signed)
Brooke Perkins to be D/C'd Skilled nursing facility per MD order.  Discussed with the patient and all questions fully answered.  VSS. Dressing intact with breast binder on. JP drain charged.   Patient escorted via [facility] to [facility] [place] [facility] 01/22/2016 11:52 AM

## 2016-01-22 NOTE — Progress Notes (Signed)
Report called to [phone] Supervisor at [facility] for discharge.

## 2016-01-22 NOTE — Progress Notes (Signed)
Patient ID: Brooke Perkins, female   DOB: 09-25-1916, 82 y.o.   MRN: [id number] 1 Day Post-Op  Subjective: She feels tired. Mild pain. No other complaints.  Objective: Vital signs in last 24 hours: Temp:  [97.4 F (36.3 C)-98.5 F (36.9 C)] 98 F (36.7 C) (07/27 [id number]) Pulse Rate:  [51-91] 83 (07/27 0633) Resp:  [11-22] 15 (07/27 0633) BP: (130-233)/(68-179) 168/73 (07/27 0633) SpO2:  [90 %-100 %] 90 % (07/27 [id number]) Weight:  [66.2 kg (146 lb)] 66.2 kg (146 lb) (07/26 1511)    Intake/Output from previous day: 07/26 0701 - 07/27 0700 In: 535 [P.O.:120; I.V.:400] Out: 1260 [Urine:1100; Drains:110; Blood:50] Intake/Output this shift: Total I/O In: 710.8 [P.O.:220; I.V.:490.8] Out: 100 [Urine:100]  General appearance: alert, cooperative and no distress Incision/Wound: no swelling or bruising. JP drainage serosanguineous and appropriate amounts  Lab Results:   Recent Labs  01/21/16 1611 01/22/16 0241  WBC 9.9 7.5  HGB 13.1 11.0*  HCT 41.8 34.1*  PLT 191 179   BMET  Recent Labs  01/21/16 1611 01/22/16 0241  NA  --  140  K  --  3.6  CL  --  104  CO2  --  30  GLUCOSE  --  99  BUN  --  19  CREATININE 0.43* 0.49  CALCIUM  --  9.1     Studies/Results: No results found.  Anti-infectives: Anti-infectives    Start     Dose/Rate Route Frequency Ordered Stop   01/21/16 0930  ceFAZolin (ANCEF) IVPB 2g/100 mL premix     2 g 200 mL/hr over 30 Minutes Intravenous To [facility] 01/20/16 0905 01/21/16 1100      Assessment/Plan: s/p Procedure(s): LEFT TOTAL MASTECTOMY Doing well postoperatively without apparent complication. Should be okay for discharge to [facility] today   LOS: 0 days    Emilea Goga T 01/22/2016

## 2016-01-22 NOTE — Progress Notes (Signed)
PT Cancellation Note  Patient Details Name: Brooke Perkins MRN: [id number] DOB: 12-30-1916   Cancelled Treatment:    Reason Eval/Treat Not Completed: Other (comment) (screened by nursing). Nursing notified PT that the patient does not need services at this time as she is about to D/C to SNF.    Cassell Clement, PT, CSCS Pager [phone] Office [phone]  01/22/2016, 11:26 AM

## 2016-01-22 NOTE — Clinical Social Work Note (Addendum)
CSW facilitated patient discharge including contacting patient family and facility to confirm patient discharge plans. Clinical information faxed to facility and family agreeable with plan. CSW arranged ambulance transport via [facility] to [facility]. RN to call report prior to discharge ([phone]).  CSW will sign off for now as social work intervention is no longer needed. Please consult us again if new needs arise.  Montrail Mehrer, CSW [phone]   

## 2016-01-22 NOTE — NC FL2 (Signed)
[place] LEVEL OF CARE SCREENING TOOL     IDENTIFICATION  Patient Name: Brooke Perkins Birthdate: Nov 15, 1916 Sex: female Admission Date (Current Location): 01/21/2016  [facility] and [place] Number:  Herbalist and Address:  [facility], [place] [address], [place], [place] [address]      Provider Number: [id number]  Attending Physician Name and Address:  Excell Seltzer, MD  Relative Name and Phone Number:       Current Level of Care: Hospital Recommended Level of Care: [place] Prior Approval Number:    Date Approved/Denied:   PASRR Number: [id number]  Discharge Plan: SNF    Current Diagnoses: Patient Active Problem List   Diagnosis Date Noted  . Cancer of left breast, stage 2 ([place]) 01/21/2016  . Cancer of central portion of left female breast ([place]) 12/22/2015  . Breast mass, left   . Left breast mass 11/17/2015  . Fall 11/16/2015  . Hypokalemia 11/16/2015  . Elevated blood pressure 11/16/2015  . Ambulatory dysfunction 11/16/2015  . UTI (lower urinary tract infection) 11/16/2015  . Protein-calorie malnutrition, moderate ([place]) 11/16/2015  . Arthritis   . Compression fracture of L4 lumbar vertebra ([facility])     Orientation RESPIRATION BLADDER Height & Weight     Self, Time, Place  Normal Continent Weight: 146 lb (66.2 kg) Height:     BEHAVIORAL SYMPTOMS/MOOD NEUROLOGICAL BOWEL NUTRITION STATUS   (None)  (None) Continent Diet (Soft)  AMBULATORY STATUS COMMUNICATION OF NEEDS Skin   Limited Assist Verbally Surgical wounds                       Personal Care Assistance Level of Assistance  Bathing, Feeding, Dressing Bathing Assistance: Limited assistance Feeding assistance: Independent Dressing Assistance: Limited assistance     Functional Limitations Info  Sight, Hearing, Speech Sight Info: Adequate Hearing Info: Adequate Speech Info: Adequate    SPECIAL CARE FACTORS FREQUENCY  Blood  pressure                    Contractures Contractures Info: Not present    Additional Factors Info  Code Status, Allergies Code Status Info: Partial Allergies Info: NKDA           Current Medications (01/22/2016):  This is the current hospital active medication list Current Facility-Administered Medications  Medication Dose Route Frequency Provider Last Rate Last Dose  . 0.9 % NaCl with KCl 20 mEq/ L  infusion   Intravenous Continuous Excell Seltzer, MD 50 mL/hr at 01/21/16 1554    . enoxaparin (LOVENOX) injection 30 mg  30 mg Subcutaneous Q24H Excell Seltzer, MD   30 mg at 01/22/16 0816  . metoprolol (LOPRESSOR) injection 5 mg  5 mg Intravenous Q6H PRN Excell Seltzer, MD   5 mg at 01/21/16 1255  . morphine 2 MG/ML injection 1-2 mg  1-2 mg Intravenous Q2H PRN Excell Seltzer, MD      . ondansetron (ZOFRAN-ODT) disintegrating tablet 4 mg  4 mg Oral Q6H PRN Excell Seltzer, MD       Or  . ondansetron (ZOFRAN) injection 4 mg  4 mg Intravenous Q6H PRN Excell Seltzer, MD      . oxyCODONE-acetaminophen (PERCOCET/ROXICET) 5-325 MG per tablet 1 tablet  1 tablet Oral Q4H PRN Excell Seltzer, MD      . polyethylene glycol (MIRALAX / GLYCOLAX) packet 17 g  17 g Oral Daily Excell Seltzer, MD      .  senna-docusate (Senokot-S) tablet 1 tablet  1 tablet Oral BID Excell Seltzer, MD      . traMADol Veatrice Bourbon) tablet 50 mg  50 mg Oral BID PRN Excell Seltzer, MD         Discharge Medications: Please see discharge summary for a list of discharge medications.  Relevant Imaging Results:  Relevant Lab Results:   Additional Information SS#: [id number]  Candie Chroman, LCSW

## 2016-01-22 NOTE — Discharge Summary (Signed)
   Patient ID: Brooke Perkins [id number] 82 y.o. May 20, 1917  01/21/2016  Discharge date and time: 01/22/2016   Admitting Physician: Excell Seltzer T  Discharge Physician: Excell Seltzer T  Admission Diagnoses: LEFT BREAST CANCER  Discharge Diagnoses: Same  Operations: Procedure(s): LEFT TOTAL MASTECTOMY  Admission Condition: fair  Discharged Condition: fair  Indication for Admission: Patient is a 82 year old female who presents with a large left breast mass with skin breakdown and central ulceration of the breast. This measures approximately 5 cm on ultrasound and biopsy has revealed invasive ductal carcinoma. After preoperative discussion regarding treatment alternatives with the patient and her son with elected to proceed with simple mastectomy  Hospital Course: On the morning of admission the patient underwent an uneventful simple mastectomy. She tolerated the procedure well and there were no apparent postoperative complications. On the first postoperative day she is alert with just mild discomfort. Vital signs are stable. Hemoglobin is 11. Her incision is clean without swelling or bleeding and JP drainage is appropriate. She is felt stable for return to [facility].   Disposition: Nursing Home  Patient Instructions:    Medication List    TAKE these medications   acetaminophen 650 MG CR tablet Commonly known as:  TYLENOL Take 650 mg by mouth every 8 (eight) hours as needed for pain. Reported on 12/22/2015   calcitonin (salmon) 200 UNIT/ACT nasal spray Commonly known as:  MIACALCIN/FORTICAL Place 1 spray into alternate nostrils daily.   glucosamine-chondroitin 500-400 MG tablet Take 1 tablet by mouth 3 (three) times daily.   oxyCODONE-acetaminophen 5-325 MG tablet Commonly known as:  PERCOCET/ROXICET Take 1 tablet by mouth every 4 (four) hours as needed for moderate pain. DO NOT EXCEED 4GM OF APAP IN 24 HOURS FROM ALL SOURCES   polyethylene glycol  packet Commonly known as:  MIRALAX / GLYCOLAX Take 17 g by mouth daily.   senna-docusate 8.6-50 MG tablet Commonly known as:  Senokot-S Take 1 tablet by mouth 2 (two) times daily.   traMADol 50 MG tablet Commonly known as:  ULTRAM Take 1 tablet (50 mg total) by mouth 2 (two) times daily as needed for moderate pain or severe pain.   UNABLE TO FIND Med Name: Med pass 120 mL BID between meals   vitamin B-12 100 MCG tablet Commonly known as:  CYANOCOBALAMIN Take 100 mcg by mouth daily.   vitamin C 500 MG tablet Commonly known as:  ASCORBIC ACID Take 500 mg by mouth daily.   Vitamin D (Ergocalciferol) 50000 units Caps capsule Commonly known as:  DRISDOL Take 50,000 Units by mouth every 7 (seven) days.       Activity: activity as tolerated Diet: regular diet Wound Care: keep wound clean and dry and empty JP drain twice daily and record amount  Follow-up:  With Dr. Excell Seltzer in 1 week.  Signed: Edward Jolly MD, FACS  01/22/2016, 8:34 AM

## 2016-01-22 NOTE — Discharge Instructions (Signed)
[facility]___[facility] [phone]  MASTECTOMY: POST OP INSTRUCTIONS  Always review your discharge instruction sheet given to you by the facility where your surgery was performed. IF YOU HAVE DISABILITY OR FAMILY LEAVE FORMS, YOU MUST BRING THEM TO THE OFFICE FOR PROCESSING.   DO NOT GIVE THEM TO YOUR DOCTOR. A prescription for pain medication may be given to you upon discharge.  Take your pain medication as prescribed, if needed.  If narcotic pain medicine is not needed, then you may take acetaminophen (Tylenol) or ibuprofen (Advil) as needed. 1. Take your usually prescribed medications unless otherwise directed. 2. If you need a refill on your pain medication, please contact your pharmacy.  They will contact our office to request authorization.  Prescriptions will not be filled after 5pm or on week-ends. 3. You should follow a light diet the first few days after arrival home, such as soup and crackers, etc.  Resume your normal diet the day after surgery. 4. Most patients will experience some swelling and bruising on the chest and underarm.  Ice packs will help.  Swelling and bruising can take several days to resolve.  5. It is common to experience some constipation if taking pain medication after surgery.  Increasing fluid intake and taking a stool softener (such as Colace) will usually help or prevent this problem from occurring.  A mild laxative (Milk of Magnesia or Miralax) should be taken according to package instructions if there are no bowel movements after 48 hours. 6. Unless discharge instructions indicate otherwise, leave your bandage dry and in place until your next appointment in 3-5 days.  You may take a limited sponge bath.  No tube baths or showers until the drains are removed.  You may have steri-strips (small skin tapes) in place directly over the incision.  These strips should be left on the skin for 7-10 days.  If your surgeon used skin glue on the incision, you may  shower in 24 hours.  The glue will flake off over the next 2-3 weeks.  Any sutures or staples will be removed at the office during your follow-up visit. 7. DRAINS:  If you have drains in place, it is important to keep a list of the amount of drainage produced each day in your drains.  Before leaving the hospital, you should be instructed on drain care.  Call our office if you have any questions about your drains. 8. ACTIVITIES:  You may resume regular (light) daily activities beginning the next day--such as daily self-care, walking, climbing stairs--gradually increasing activities as tolerated.  You may have sexual intercourse when it is comfortable.  Refrain from any heavy lifting or straining until approved by your doctor. a. You may drive when you are no longer taking prescription pain medication, you can comfortably wear a seatbelt, and you can safely maneuver your car and apply brakes. b. RETURN TO WORK:  __________________________________________________________ 9. You should see your doctor in the office for a follow-up appointment approximately 3-5 days after your surgery.  Your doctors nurse will typically make your follow-up appointment when she calls you with your pathology report.  Expect your pathology report 2-3 business days after your surgery.  You may call to check if you do not hear from [place] after three days.   10. OTHER INSTRUCTIONS: ___Empty drain twice daily and record amount___________________________________________________________________________________________ ____________________________________________________________________________________________ WHEN TO CALL YOUR DOCTOR: 1. Fever over 101.0 2. Nausea and/or vomiting 3. Extreme swelling or bruising 4. Continued bleeding from incision. 5. Increased pain,  redness, or drainage from the incision. The clinic staff is available to answer your questions during regular business hours.  Please dont hesitate to call and ask to  speak to one of the nurses for clinical concerns.  If you have a medical emergency, go to the nearest emergency room or call 911.  A surgeon from [facility] is always on call at the hospital. [address], [place], [place], [place]  [address] ? [address] [place] [address], [place], [place]   [address] [phone] ? [phone] ? FAX [phone] Web site: [email or web address]

## 2016-01-23 ENCOUNTER — Encounter: Payer: Self-pay | Admitting: Internal Medicine

## 2016-01-23 ENCOUNTER — Non-Acute Institutional Stay (SKILLED_NURSING_FACILITY): Payer: Medicare Other | Admitting: Internal Medicine

## 2016-01-23 DIAGNOSIS — D638 Anemia in other chronic diseases classified elsewhere: Secondary | ICD-10-CM | POA: Diagnosis not present

## 2016-01-23 DIAGNOSIS — E46 Unspecified protein-calorie malnutrition: Secondary | ICD-10-CM | POA: Diagnosis not present

## 2016-01-23 DIAGNOSIS — R5381 Other malaise: Secondary | ICD-10-CM

## 2016-01-23 DIAGNOSIS — C50912 Malignant neoplasm of unspecified site of left female breast: Secondary | ICD-10-CM | POA: Diagnosis not present

## 2016-01-23 DIAGNOSIS — R42 Dizziness and giddiness: Secondary | ICD-10-CM

## 2016-01-23 DIAGNOSIS — K5901 Slow transit constipation: Secondary | ICD-10-CM | POA: Diagnosis not present

## 2016-01-23 NOTE — Progress Notes (Signed)
LOCATION: [facility]  PCP: [name], MD   Code Status: DNR  Goals of care: Advanced Directive information Advanced Directives 01/21/2016  Does patient have an advance directive? Yes  Type of Advance Directive -  Does patient want to make changes to advanced directive? No - Patient declined  Copy of advanced directive(s) in chart? Yes  Would patient like information on creating an advanced directive? -  Pre-existing out of facility DNR order (yellow form or pink MOST form) Pink MOST form placed in chart (order not valid for inpatient use)       Extended Emergency Contact Information Primary Emergency Contact: Sturtevant,Jeffery Address: [address]           [place], [place] [address] [place] Phone: [phone] Relation: Son   Allergies  Allergen Reactions  . No Known Allergies     Chief Complaint  Patient presents with  . Readmit To SNF    Readmission     HPI:  Patient is a 82 y.o. female seen today for short term rehabilitation post hospital admission from 01/21/16-01/12/16 with left breast mastectomy for invasive ductal carcinoma of left breast. She is seen in her room today. She was at this facility undergoing rehabilitation prior to this admission.    Review of Systems:  Constitutional: Negative for fever, chills HENT: Negative for headache, congestion, nasal discharge. She has hearing loss   Eyes: Negative for blurred vision.  Respiratory: Negative for cough, shortness of breath and wheezing.   Cardiovascular: Negative for chest pain, palpitations, leg swelling.  Gastrointestinal: Negative for heartburn, nausea, vomiting, abdominal pain. Last bowel movement was yesterday.  Genitourinary: Negative for dysuria and flank pain.  Musculoskeletal: Negative for back pain, fall in the facility.  Skin: Negative for itching, rash.  Neurological: Positive for dizziness with change of position. Psychiatric/Behavioral: Negative for  depression   Past Medical History:  Diagnosis Date  . Arthritis   . Breast cancer, left breast ([place])     invasive ductal carcinoma with erosion through the skin and a central ulceration lateral to the nipple measuring about 4 cm in diameter/notes 01/21/2016  . Complication of anesthesia   . Fall   . History of hiatal hernia   . PONV (postoperative nausea and vomiting)   . Protein calorie malnutrition ([facility])    Past Surgical History:  Procedure Laterality Date  . ABDOMINAL HYSTERECTOMY    . APPENDECTOMY    . CHOLECYSTECTOMY    . EYE SURGERY     cataracts bilateral eyes  . FRACTURE SURGERY Right    wrist surgery, cadaver implant  . MASTECTOMY COMPLETE / SIMPLE Left 01/21/2016  . TONSILLECTOMY    . TOTAL MASTECTOMY Left 01/21/2016   Procedure: LEFT TOTAL MASTECTOMY;  Surgeon: Excell Seltzer, MD;  Location: [facility];  Service: General;  Laterality: Left;   Social History:   reports that she has never smoked. She has never used smokeless tobacco. She reports that she does not drink alcohol or use drugs.  Family History  Problem Relation Age of Onset  . Arthritis/Rheumatoid Mother     Medications:   Medication List       Accurate as of 01/23/16  2:23 PM. Always use your most recent med list.          acetaminophen 650 MG CR tablet Commonly known as:  TYLENOL Take 650 mg by mouth every 8 (eight) hours as needed for pain. Reported on 12/22/2015   calcitonin (salmon) 200 UNIT/ACT nasal  spray Commonly known as:  MIACALCIN/FORTICAL Place 1 spray into alternate nostrils daily.   glucosamine-chondroitin 500-400 MG tablet Take 1 tablet by mouth 3 (three) times daily.   oxyCODONE-acetaminophen 5-325 MG tablet Commonly known as:  PERCOCET/ROXICET Take 1 tablet by mouth every 4 (four) hours as needed for moderate pain. DO NOT EXCEED 4GM OF APAP IN 24 HOURS FROM ALL SOURCES   polyethylene glycol packet Commonly known as:  MIRALAX / GLYCOLAX Take 17 g by mouth daily.     senna-docusate 8.6-50 MG tablet Commonly known as:  Senokot-S Take 1 tablet by mouth 2 (two) times daily.   traMADol 50 MG tablet Commonly known as:  ULTRAM Take 1 tablet (50 mg total) by mouth 2 (two) times daily as needed for moderate pain or severe pain.   UNABLE TO FIND Med Name: Med pass 120 mL BID between meals   vitamin B-12 100 MCG tablet Commonly known as:  CYANOCOBALAMIN Take 100 mcg by mouth daily.   vitamin C 500 MG tablet Commonly known as:  ASCORBIC ACID Take 500 mg by mouth daily.   Vitamin D (Ergocalciferol) 50000 units Caps capsule Commonly known as:  DRISDOL Take 50,000 Units by mouth every 7 (seven) days.       Immunizations:  There is no immunization history on file for this patient.   Physical Exam: Vitals:   01/23/16 1417  BP: (!) 163/73  Pulse: 78  Resp: 20  Temp: 97.3 F (36.3 C)  TempSrc: Oral  SpO2: 93%  Weight: 146 lb (66.2 kg)  Height: 5' 6.5" (1.689 m)   Body mass index is 23.21 kg/m.  BP lying down is 150/70 and same with her sitting up in bed.   General- elderly female, frail and thin built, in no acute distress Head- normocephalic, atraumatic Nose- no nasal discharge Throat- moist mucus membrane Eyes- PERRLA, EOMI, no pallor, no icterus, no discharge Neck- no cervical lymphadenopathy Chest- left mastectomy with dressing in place Cardiovascular- normal s1,s2, no murmur, no leg edema Respiratory- bilateral clear to auscultation, no wheeze, no rhonchi, no crackles, no use of accessory muscles Abdomen- bowel sounds present, soft, non tender Musculoskeletal- able to move all 4 extremities, generalized weakness, arthritis changes to her fingers Neurological- alert and oriented to person, place and time Skin- warm and dry, left chest wall has surgical incision with staples in place, mild erythema present, JP drain in place with blood stained drainage.  Psychiatry- normal mood and affect    Labs reviewed: Basic Metabolic  Panel:  Recent Labs  11/17/15 0418  11/18/15 0410 12/11/15 0037 01/07/16 01/21/16 1611 01/22/16 0241  NA  --   < > 140 142 146  --  140  K  --   < > 3.3* 3.2* 3.6  --  3.6  CL  --   < > 109 109  --   --  104  CO2  --   < > 28 28  --   --  30  GLUCOSE  --   < > 98 101*  --   --  99  BUN  --   < > 19 18 20   --  19  CREATININE  --   < > 0.43* 0.30* 0.5 0.43* 0.49  CALCIUM  --   < > 8.3* 8.8*  --   --  9.1  MG 1.9  --   --   --   --   --   --   < > = values in  this interval not displayed. Liver Function Tests:  Recent Labs  11/16/15 1659 12/11/15 0037 01/07/16  AST 44* 16 13  ALT 26 11* 7  ALKPHOS 52 88 54  BILITOT 1.2 0.8  --   PROT 6.5 5.7*  --   ALBUMIN 3.8 3.4*  --    No results for input(s): LIPASE, AMYLASE in the last 8760 hours. No results for input(s): AMMONIA in the last 8760 hours. CBC:  Recent Labs  11/16/15 1659 11/17/15 0907  12/11/15 0037 01/07/16 01/21/16 1611 01/22/16 0241  WBC 9.2 7.5  < > 5.6 5.1 9.9 7.5  NEUTROABS 7.9* 6.1  --  3.6  --   --   --   HGB 13.8 13.7  < > 12.7 12.5 13.1 11.0*  HCT 42.5 41.0  < > 39.6 40 41.8 34.1*  MCV 100.2* 99.3  < > 102.9*  --  101.5* 99.1  PLT 125* 113*  < > 149* 107* 191 179  < > = values in this interval not displayed. Cardiac Enzymes:  Recent Labs  11/16/15 1659  CKTOTAL 551*     Assessment/Plan   Physical deconditioning With generalized weakness. Will have her work with physical therapy and occupational therapy team to help with gait training and muscle strengthening exercises.fall precautions. Skin care. Encourage to be out of bed.   Left breast invasive ductal carcinoma S/p left mastectomy. Continue wound care. Has follow up with surgery. Currently on tylenol 650 mg q8h prn pain, percocet 5-325 mg 1 tab q4h prn pain and tramadol 50 mg bid prn pain. Change this to tylenol 650 mg tid for pain with tramadol 50 mg 1-2 tab q6h prn pain. Discontinue percocet with her dizziness  Dizziness Negative for  orthostasis. Check orthostatic vitals once a day. Check cbc and cmp. Slow position change encouraged  Protein calorie malnutrition Monitor po intake. Get RD consult and continue medpass supplement  Constipation Continue daily miralax and senokot s bid  Anemia of chronic disease Continue b12 supplement and given her blood loss with surgery, check cbc  Elevated BP Check bp bid for now, not on any antihypertensive, monitor    Goals of care: short term rehabilitation   Labs/tests ordered: cbc, cmp   Family/ staff Communication: reviewed care plan with patient and nursing supervisor    Blanchie Serve, MD Internal Medicine [facility] [place], [place] [address] Cell Phone (Monday-Friday 8 am - 5 pm): [phone] On Call: [phone] and follow prompts after 5 pm and on weekends Office Phone: [phone] Office Fax: [phone]

## 2016-01-26 ENCOUNTER — Other Ambulatory Visit: Payer: Self-pay | Admitting: *Deleted

## 2016-01-26 MED ORDER — TRAMADOL HCL 50 MG PO TABS: ORAL_TABLET | ORAL | 0 refills | Status: DC

## 2016-02-16 ENCOUNTER — Encounter: Payer: Self-pay | Admitting: Adult Health

## 2016-02-16 ENCOUNTER — Non-Acute Institutional Stay (SKILLED_NURSING_FACILITY): Payer: Medicare Other | Admitting: Adult Health

## 2016-02-16 DIAGNOSIS — C50912 Malignant neoplasm of unspecified site of left female breast: Secondary | ICD-10-CM

## 2016-02-16 DIAGNOSIS — R5381 Other malaise: Secondary | ICD-10-CM | POA: Diagnosis not present

## 2016-02-16 DIAGNOSIS — E559 Vitamin D deficiency, unspecified: Secondary | ICD-10-CM

## 2016-02-16 DIAGNOSIS — D696 Thrombocytopenia, unspecified: Secondary | ICD-10-CM

## 2016-02-16 DIAGNOSIS — D638 Anemia in other chronic diseases classified elsewhere: Secondary | ICD-10-CM | POA: Diagnosis not present

## 2016-02-16 DIAGNOSIS — E46 Unspecified protein-calorie malnutrition: Secondary | ICD-10-CM | POA: Diagnosis not present

## 2016-02-16 DIAGNOSIS — K5901 Slow transit constipation: Secondary | ICD-10-CM | POA: Diagnosis not present

## 2016-02-16 DIAGNOSIS — M810 Age-related osteoporosis without current pathological fracture: Secondary | ICD-10-CM | POA: Diagnosis not present

## 2016-02-16 NOTE — Progress Notes (Signed)
Patient ID: Brooke Perkins, female   DOB: 05-Nov-1916, 82 y.o.   MRN: [id number]    DATE:  02/16/2016   MRN:  [id number]  BIRTHDAY: 28-Aug-1916  Facility:  [facility] Home Location:  [facility] Room Number: 908-P  LEVEL OF CARE:  SNF (31)  Contact Information    Name Relation Home Work Mobile   [place] Son [phone]         Code Status History    Date Active Date Inactive Code Status Order ID Comments User Context   01/21/2016  2:58 PM 01/22/2016  2:56 PM Partial Code [id number]  Excell Seltzer, MD Inpatient   11/16/2015  9:55 PM 11/18/2015  9:05 PM Full Code [id number]  Ivor Costa, MD ED    Questions for Most Recent Historical Code Status (Order [id number])    Question Answer Comment   In the event of cardiac or respiratory ARREST: Initiate Code Blue, Call Rapid Response No    In the event of cardiac or respiratory ARREST: Perform CPR No    In the event of cardiac or respiratory ARREST: Perform Intubation/Mechanical Ventilation No    In the event of cardiac or respiratory ARREST: Use NIPPV/BiPAp only if indicated No    In the event of cardiac or respiratory ARREST: Administer ACLS medications if indicated Yes    In the event of cardiac or respiratory ARREST: Perform Defibrillation or Cardioversion if indicated Yes         Advance Directive Documentation   Flowsheet Row Most Recent Value  Type of Advance Directive  Out of facility DNR (pink MOST or yellow form)  Pre-existing out of facility DNR order (yellow form or pink MOST form)  No data  "MOST" Form in Place?  No data       Chief Complaint  Patient presents with  . Medical Management of Chronic Issues    HISTORY OF PRESENT ILLNESS:  This is a 82 year old female who is being seen for a routine visit. She had just finished antibiotic (Keflex)  for her left mastectomy. Site is dry and no erythema.   She has been [facility]ad[facility] to [facility] on [facility] from the hospital with left  breast cancer for which she had left breast mastectomy on 01/21/16.  She has been admitted for a short-term rehabilitation.   PAST MEDICAL HISTORY:  Past Medical History:  Diagnosis Date  . Arthritis   . Breast cancer, left breast ([place])     invasive ductal carcinoma with erosion through the skin and a central ulceration lateral to the nipple measuring about 4 cm in diameter/notes 01/21/2016  . Complication of anesthesia   . Fall   . History of hiatal hernia   . PONV (postoperative nausea and vomiting)   . Protein calorie malnutrition ([place])      CURRENT MEDICATIONS: Reviewed  Patient's Medications  New Prescriptions   No medications on file  Previous Medications   ACETAMINOPHEN (TYLENOL) 650 MG CR TABLET    Take 650 mg by mouth every 8 (eight) hours as needed for pain. Reported on 12/22/2015   CALCITONIN, SALMON, (MIACALCIN/FORTICAL) 200 UNIT/ACT NASAL SPRAY    Place 1 spray into alternate nostrils daily.   CEPHALEXIN (KEFLEX) 500 MG CAPSULE    Take 500 mg by mouth 3 (three) times daily.   GLUCOSAMINE-CHONDROITIN 500-400 MG TABLET    Take 1 tablet by mouth 3 (three) times daily.   POLYETHYLENE GLYCOL (MIRALAX / GLYCOLAX) PACKET    Take 17 g  by mouth daily.   SENNA-DOCUSATE (SENOKOT-S) 8.6-50 MG TABLET    Take 1 tablet by mouth 2 (two) times daily.   TRAMADOL (ULTRAM) 50 MG TABLET    Take one tablet by mouth every 6 hours as needed for moderate pain; Take two tablets by mouth every 6 hours as needed for severe pain.   UNABLE TO FIND    Med Name: Med pass 120 mL BID between meals   VITAMIN B-12 (CYANOCOBALAMIN) 100 MCG TABLET    Take 100 mcg by mouth daily.   VITAMIN C (ASCORBIC ACID) 500 MG TABLET    Take 500 mg by mouth daily.   VITAMIN D, ERGOCALCIFEROL, (DRISDOL) 50000 UNITS CAPS CAPSULE    Take 50,000 Units by mouth every 7 (seven) days.  Modified Medications   No medications on file  Discontinued Medications   OXYCODONE-ACETAMINOPHEN (PERCOCET/ROXICET) 5-325 MG TABLET    Take 1  tablet by mouth every 4 (four) hours as needed for moderate pain. DO NOT EXCEED 4GM OF APAP IN 24 HOURS FROM ALL SOURCES     Allergies  Allergen Reactions  . No Known Allergies      REVIEW OF SYSTEMS:  GENERAL: no change in appetite, no fatigue, no weight changes, no fever, chills or weakness EYES: Denies change in vision, dry eyes, eye pain, itching or discharge EARS: Denies change in hearing, ringing in ears, or earache NOSE: Denies nasal congestion or epistaxis MOUTH and THROAT: Denies oral discomfort, gingival pain or bleeding, pain from teeth or hoarseness   RESPIRATORY: no cough, SOB, DOE, wheezing, hemoptysis CARDIAC: no chest pain, edema or palpitations GI: no abdominal pain, diarrhea, constipation, heart burn, nausea or vomiting GU: Denies dysuria, frequency, hematuria, incontinence, or discharge PSYCHIATRIC: Denies feeling of depression or anxiety. No report of hallucinations, insomnia, paranoia, or agitation   PHYSICAL EXAMINATION  GENERAL APPEARANCE:  In no acute distress.  SKIN:  Left breast mastectomy surgical site has steri-strips, dry and no erythema HEAD: Normal in size and contour. No evidence of trauma EYES: Lids open and close normally. No blepharitis, entropion or ectropion. PERRL. Conjunctivae are clear and sclerae are white. Lenses are without opacity EARS: Pinnae are normal. Patient hears normal voice tunes of the examiner MOUTH and THROAT: Lips are without lesions. Oral mucosa is moist and without lesions. Tongue is normal in shape, size, and color and without lesions NECK: supple, trachea midline, no neck masses, no thyroid tenderness, no thyromegaly LYMPHATICS: no LAN in the neck, no supraclavicular LAN RESPIRATORY: breathing is even & unlabored, BS CTAB CARDIAC: RRR, no murmur,no extra heart sounds, no edema GI: abdomen soft, normal BS, no masses, no tenderness, no hepatomegaly, no splenomegaly EXTREMITIES:  Able to move X 4 extremities PSYCHIATRIC:  Alert and oriented X 3. Affect and behavior are appropriate  LABS/RADIOLOGY: Labs reviewed: Basic Metabolic Panel:  Recent Labs  11/17/15 0418  11/18/15 0410 12/11/15 0037 01/07/16 01/21/16 1611 01/22/16 0241  NA  --   < > 140 142 146  --  140  K  --   < > 3.3* 3.2* 3.6  --  3.6  CL  --   < > 109 109  --   --  104  CO2  --   < > 28 28  --   --  30  GLUCOSE  --   < > 98 101*  --   --  99  BUN  --   < > 19 18 20   --  19  CREATININE  --   < >  0.43* 0.30* 0.5 0.43* 0.49  CALCIUM  --   < > 8.3* 8.8*  --   --  9.1  MG 1.9  --   --   --   --   --   --   < > = values in this interval not displayed. Liver Function Tests:  Recent Labs  11/16/15 1659 12/11/15 0037 01/07/16  AST 44* 16 13  ALT 26 11* 7  ALKPHOS 52 88 54  BILITOT 1.2 0.8  --   PROT 6.5 5.7*  --   ALBUMIN 3.8 3.4*  --    CBC:  Recent Labs  11/16/15 1659 11/17/15 0907  12/11/15 0037 01/07/16 01/21/16 1611 01/22/16 0241  WBC 9.2 7.5  < > 5.6 5.1 9.9 7.5  NEUTROABS 7.9* 6.1  --  3.6  --   --   --   HGB 13.8 13.7  < > 12.7 12.5 13.1 11.0*  HCT 42.5 41.0  < > 39.6 40 41.8 34.1*  MCV 100.2* 99.3  < > 102.9*  --  101.5* 99.1  PLT 125* 113*  < > 149* 107* 191 179  < > = values in this interval not displayed.  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 - continue wound care; follow-up with surgery; continue tramadol 50 mg 1-2 tabs by mouth every 6 hours when necessary and Tylenol 325 mg take 2 tabs = 650 mg by mouth every 8 hours for pain  Osteoporosis - continue Miacalcin 200 units 1 spray into left knee area every other day and 1 spray into a right nare every other day and vitamin D3 50,000 units by mouth every Fridays  Protein calorie malnutrition - albumin 3.4; continue med Pass 120 mL by mouth twice a day  Constipation -  continue senna S 8.6-50 mg 1 tab by mouth twice a day  Anemia of chronic disease - stable; check CBC Lab Results  Component Value Date   WBC 7.5 01/22/2016   HGB 11.0 (L) 01/22/2016   HCT 34.1 (L) 01/22/2016   MCV 99.1 01/22/2016   PLT 179 01/22/2016   Vitamin D deficiency - continue vitamin D3 50,000 units by mouth every Fridays   Thrombocytopenia - platelet  107 , low; no bruising nor bleeding noted; monitor for bleeding; check CBC     Goals of care:  Short-term rehabilitation    Durenda Age, NP [place] [phone]

## 2016-02-17 DIAGNOSIS — D649 Anemia, unspecified: Secondary | ICD-10-CM | POA: Diagnosis not present

## 2016-02-17 DIAGNOSIS — Z79899 Other long term (current) drug therapy: Secondary | ICD-10-CM | POA: Diagnosis not present

## 2016-02-17 LAB — CBC AND DIFFERENTIAL
HEMATOCRIT: 34 % — AB (ref 36–46)
Hemoglobin: 10.9 g/dL — AB (ref 12.0–16.0)
NEUTROS ABS: 3 /uL
PLATELETS: 163 10*3/uL (ref 150–399)
WBC: 4.9 10^3/mL

## 2016-02-17 LAB — BASIC METABOLIC PANEL
BUN: 17 mg/dL (ref 4–21)
Creatinine: 0.4 mg/dL — AB (ref 0.5–1.1)
GLUCOSE: 84 mg/dL
Potassium: 3.5 mmol/L (ref 3.4–5.3)
SODIUM: 144 mmol/L (ref 137–147)

## 2016-02-18 NOTE — Progress Notes (Signed)
No show  This encounter was created in error - please disregard.

## 2016-02-20 ENCOUNTER — Encounter: Payer: Self-pay | Admitting: Hematology

## 2016-02-22 ENCOUNTER — Telehealth: Payer: Self-pay | Admitting: Hematology

## 2016-02-22 NOTE — Telephone Encounter (Signed)
Lvm advising appt 9/12 @ 1.15pm.

## 2016-03-09 ENCOUNTER — Encounter: Payer: Self-pay | Admitting: Hematology

## 2016-03-09 ENCOUNTER — Telehealth: Payer: Self-pay | Admitting: Hematology

## 2016-03-09 ENCOUNTER — Ambulatory Visit (HOSPITAL_BASED_OUTPATIENT_CLINIC_OR_DEPARTMENT_OTHER): Payer: Medicare Other | Admitting: Hematology

## 2016-03-09 VITALS — BP 161/66 | HR 79 | Temp 98.2°F | Resp 18 | Ht 65.0 in

## 2016-03-09 DIAGNOSIS — Z9181 History of falling: Secondary | ICD-10-CM

## 2016-03-09 DIAGNOSIS — E46 Unspecified protein-calorie malnutrition: Secondary | ICD-10-CM | POA: Diagnosis not present

## 2016-03-09 DIAGNOSIS — C50112 Malignant neoplasm of central portion of left female breast: Secondary | ICD-10-CM | POA: Diagnosis not present

## 2016-03-09 DIAGNOSIS — Z170 Estrogen receptor positive status [ER+]: Secondary | ICD-10-CM | POA: Diagnosis not present

## 2016-03-09 DIAGNOSIS — M1990 Unspecified osteoarthritis, unspecified site: Secondary | ICD-10-CM

## 2016-03-09 DIAGNOSIS — Z993 Dependence on wheelchair: Secondary | ICD-10-CM

## 2016-03-09 NOTE — Progress Notes (Signed)
[place]  [phone] [phone]  Clinic Follow up Note   Patient Care Team: Deland Pretty, MD as PCP - General (Internal Medicine) 03/09/2016  SUMMARY OF ONCOLOGIC HISTORY: Oncology History   Cancer of central portion of left female breast [facility])   Staging form: Breast, AJCC 7th Edition   - Clinical stage from 12/05/2015: Stage IIB (T3, N0, M0) - Signed by Truitt Merle, MD on 12/22/2015   - Pathologic stage from 01/21/2016: Stage IIIB (T4b, N1a, cM0) - Signed by Truitt Merle, MD on 02/18/2016       Cancer of central portion of left female breast ([place])   12/05/2015 Initial Diagnosis    Cancer of central portion of left female breast ([place])      12/05/2015 Mammogram    Your irregular fungating mass within the retroviral left breast, extending to the upper inner quadrant of the left breast, at least a 5 cm in greatest dimension, with obvious skin involvement, left axillary nodes were negative by ultrasound.      12/05/2015 Initial Biopsy    Left member breast mass biopsy showed invasive lobular carcinoma      12/05/2015 Receptors her2    ER 95% positive, PR 95% positive, HER-2 negative, Ki-67 12%,      01/21/2016 Surgery    Left mastectomy and SLN biopsy       01/21/2016 Pathology Results    Left mastectomy showed G2 invasive lobular carcinoma with skin involvement, at least two foci, the larger spans 5cm, (+) LVI, posterior margin broadly <0.1cm, 1 out of 2 nodes positive for cancer       History of present illness (11/17/2015):  [name] is a 82 year old Caucasian female, with past medical history of hearing loss, arthritis, presented to the emergency room after a fall at home. She lives alone. She also has semi-dysuria, no fever at home. In the ER, urine test was positive for UTI. She was also found to have a left breast mass, which she reports it has been there for the past 2-3 months. I was called to evaluate her left breast mass. She denies  significant pain in the left breast, no discharge.   She has a son who lives in [place]. She has not told anybody about that her left breast mass, she is not ready to discuss this with her son yet. She states " I do not want to fight, I'm 82 year old." She lives independently, able to take care of herself and function well at home.   CURRENT THERAPY: pending anastrozole 32m daily   INTERVAL HISTORY: FKaseyreturns for follow-up. She is accompanied by her son Brooke Perkins clinic today. He underwent a left breast mastectomy and sentinel lymph node biopsy on 01/21/2016, she tolerated surgery well. She had some issue with her wound infection after surgery, but it's resolved and the incision has completely healed. She lives in a rehabilitation facility now, she was encouraged to have physical therapy, but she has not started, she wants to wait her surgical incision to heal completely. She is not physically active  REVIEW OF SYSTEMS:   Constitutional: Denies fevers, chills, (+) fatigue Eyes: Denies blurriness of vision Ears, nose, mouth, throat, and face: Denies mucositis or sore throat Respiratory: Denies cough, dyspnea or wheezes Cardiovascular: Denies palpitation, chest discomfort or lower extremity swelling Gastrointestinal:  Denies nausea, heartburn or change in bowel habits Skin: Denies abnormal skin rashes Lymphatics: Denies new lymphadenopathy or easy bruising Neurological:Denies numbness, tingling or new weaknesses Behavioral/Psych:  Mood is stable, no new changes  All other systems were reviewed with the patient and are negative.  MEDICAL HISTORY:  Past Medical History:  Diagnosis Date  . Arthritis   . Breast cancer, left breast ([place])     invasive ductal carcinoma with erosion through the skin and a central ulceration lateral to the nipple measuring about 4 cm in diameter/notes 01/21/2016  . Complication of anesthesia   . Fall   . History of hiatal hernia   . PONV (postoperative  nausea and vomiting)   . Protein calorie malnutrition ([place])     SURGICAL HISTORY: Past Surgical History:  Procedure Laterality Date  . ABDOMINAL HYSTERECTOMY    . APPENDECTOMY    . CHOLECYSTECTOMY    . EYE SURGERY     cataracts bilateral eyes  . FRACTURE SURGERY Right    wrist surgery, cadaver implant  . MASTECTOMY COMPLETE / SIMPLE Left 01/21/2016  . TONSILLECTOMY    . TOTAL MASTECTOMY Left 01/21/2016   Procedure: LEFT TOTAL MASTECTOMY;  Surgeon: Brooke Seltzer, MD;  Location: [facility];  Service: General;  Laterality: Left;    I have reviewed the social history and family history with the patient and they are unchanged from previous note.  ALLERGIES:  is allergic to no known allergies.  MEDICATIONS:  Current Outpatient Prescriptions  Medication Sig Dispense Refill  . acetaminophen (TYLENOL) 650 MG CR tablet Take 650 mg by mouth every 8 (eight) hours as needed for pain. Reported on 12/22/2015    . calcitonin, salmon, (MIACALCIN/FORTICAL) 200 UNIT/ACT nasal spray Place 1 spray into alternate nostrils daily.    . cephALEXin (KEFLEX) 500 MG capsule Take 500 mg by mouth 3 (three) times daily.    Marland Kitchen glucosamine-chondroitin 500-400 MG tablet Take 1 tablet by mouth 3 (three) times daily.    . polyethylene glycol (MIRALAX / GLYCOLAX) packet Take 17 g by mouth daily.    Marland Kitchen senna-docusate (SENOKOT-S) 8.6-50 MG tablet Take 1 tablet by mouth 2 (two) times daily.    . traMADol (ULTRAM) 50 MG tablet Take one tablet by mouth every 6 hours as needed for moderate pain; Take two tablets by mouth every 6 hours as needed for severe pain. 240 tablet 0  . UNABLE TO FIND Med Name: Med pass 120 mL BID between meals    . vitamin B-12 (CYANOCOBALAMIN) 100 MCG tablet Take 100 mcg by mouth daily.    . vitamin C (ASCORBIC ACID) 500 MG tablet Take 500 mg by mouth daily.    . Vitamin D, Ergocalciferol, (DRISDOL) 50000 units CAPS capsule Take 50,000 Units by mouth every 7 (seven) days.     No current  facility-administered medications for this visit.     PHYSICAL EXAMINATION: ECOG PERFORMANCE STATUS: 4 - Bedbound  Vitals:   03/09/16 1343  BP: (!) 161/66  Pulse: 79  Resp: 18  Temp: 98.2 F (36.8 C)   Filed Weights    GENERAL:alert, no distress and comfortable SKIN: skin color, texture, turgor are normal, no rashes or significant lesions EYES: normal, Conjunctiva are pink and non-injected, sclera clear OROPHARYNX:no exudate, no erythema and lips, buccal mucosa, and tongue normal  NECK: supple, thyroid normal size, non-tender, without nodularity LYMPH:  no palpable lymphadenopathy in the cervical, axillary or inguinal LUNGS: clear to auscultation and percussion with normal breathing effort HEART: regular rate & rhythm and no murmurs and no lower extremity edema ABDOMEN:abdomen soft, non-tender and normal bowel sounds Musculoskeletal:no cyanosis of digits and no clubbing  NEURO: alert &  oriented x 3 with fluent speech, no focal motor/sensory deficits Breasts: Left breast is surgically absent, the surgical incision has healed well, there is still 1 stable left. No palpable nodules on her chest wall. Exam of the right breast and axilla with showed no palpable mass.  LABORATORY DATA:  I have reviewed the data as listed CBC Latest Ref Rng & Units 01/22/2016 01/21/2016 01/07/2016  WBC 4.0 - 10.5 K/uL 7.5 9.9 5.1  Hemoglobin 12.0 - 15.0 g/dL 11.0(L) 13.1 12.5  Hematocrit 36.0 - 46.0 % 34.1(L) 41.8 40  Platelets 150 - 400 K/uL 179 191 107(A)     CMP Latest Ref Rng & Units 01/22/2016 01/21/2016 01/07/2016  Glucose 65 - 99 mg/dL 99 - -  BUN 6 - 20 mg/dL 19 - 20  Creatinine 0.44 - 1.00 mg/dL 0.49 0.43(L) 0.5  Sodium 135 - 145 mmol/L 140 - 146  Potassium 3.5 - 5.1 mmol/L 3.6 - 3.6  Chloride 101 - 111 mmol/L 104 - -  CO2 22 - 32 mmol/L 30 - -  Calcium 8.9 - 10.3 mg/dL 9.1 - -  Total Protein 6.5 - 8.1 g/dL - - -  Total Bilirubin 0.3 - 1.2 mg/dL - - -  Alkaline Phos 25 - 125 U/L - - 54    AST 13 - 35 U/L - - 13  ALT 7 - 35 U/L - - 7   PATHOLOGY REPORT  Diagnosis 12/05/2015 Breast, left, needle core biopsy, 10:00 o'clock - INVASIVE MAMMARY CARCINOMA. - SEE COMMENT. Microscopic Comment The carcinoma appears grade 1-2. An E-Cadherin stain will be performed and the results reported separately. A breast prognostic profile will be performed and the results reported separately. The results were called to [facility] on 12/08/15. (JBK:ds 12/08/15) By immunohistochemistry, the malignant cells are negative for E-Cadherin, supporting a lobular phenotype. (JBK:ds 12/08/15)  Results: IMMUNOHISTOCHEMICAL AND MORPHOMETRIC ANALYSIS PERFORMED MANUALLY Estrogen Receptor: 95%, POSITIVE, STRONG STAINING INTENSITY Progesterone Receptor: 95%, POSITIVE, STRONG STAINING INTENSITY Proliferation Marker Ki67: 12% FLUORESCENCE IN-SITU HYBRIDIZATION Results: HER2 - NEGATIVE RATIO OF HER2/CEP17 SIGNALS 1.27 AVERAGE HER2 COPY NUMBER PER CELL 2.80  Diagnosis 01/21/2016 Breast, simple mastectomy, Left - INVASIVE LOBULAR CARCINOMA, GRADE II/III, AT LEAST TWO FOCI, THE LARGER SPANS 5.0 CM. - LYMPHOVASCULAR INVASION IS IDENTIFIED. - CARCINOMA INVOLVES THE NIPPLE AND SKIN WITH ULCERATION. - INVASIVE CARCINOMA IS BROADLY LESS THAN 0.1 CM TO THE POSTERIOR MARGIN. - METASTATIC CARCINOMA IN 1 OF 2 LYMPH NODES (1/2). - SEE ONCOLOGY TABLE BELOW. Microscopic Comment BREAST, INVASIVE TUMOR, WITHOUT LYMPH NODES PRESENT Specimen, including laterality : Left breast Procedure: Simple mastectomy Histologic type: Lobular Grade: II Tubule formation: 3 Nuclear pleomorphism: 2 Mitotic: 1 Tumor size (gross measurement): 5.0 cm Margins: Invasive, distance to closest margin: Broadly less than 0.1 cm to the posterior margin Lymphovascular invasion: Present Ductal carcinoma in situ: Not identified Tumor focality: At least two foci Treatment effect: N/A Extent of tumor: Skin: Involved, with  ulceration Nipple: Involved Skeletal muscle: Not identified. Breast prognostic profile: [id number] Estrogen receptor: 95% 1 of 3 FINAL for MIRAGE, PFEFFERKORN ([id number]) Microscopic Comment(continued) Progesterone receptor: 95% Her 2 neu: No amplification was detected. Ki-67: 12% Non-neoplastic breast: Significant vascular calcifications. TNM: pT4b, pN1a Comments: In addition to the main 5.0 cm tumor, there is similar appearing invasive lobular carcinoma present in random tissue submitted from the inner quadrant of the specimen. An immunohistochemical stain for cytokeratin was used to help evaluate this case. (JBK:kh 01-26-16)  RADIOGRAPHIC STUDIES: I have personally reviewed the radiological  images as listed and agreed with the findings in the report.  Diagnostic mammogram and ultrasound bilateral breast 12/05/2015 FINDINGS: There is a large irregular mass within the upper left breast, centered at the 11-12 o'clock axis region, extending from anterior to middle depth, measuring at least 5 cm greatest dimension, with associated microcalcifications, with overlying skin retraction. Bandages overlie the mass, indicating skin involvement.  There are no dominant masses, suspicious calcifications or secondary signs of malignancy within the right breast.  On physical exam, there is a fungating mass within the retroareolar left breast, extending to the upper inner quadrant, with obvious skin involvement.  Targeted ultrasound is performed, showing an irregular hypoechoic mass within the left breast at the 10 o'clock axis, 3 cm from the nipple, with internal vascularity, difficult to measure definitively due the overlying skin involvement, measuring at least 4.1 x 2 x 3.7 cm, with punctate internal echoes compatible with the associated microcalcifications seen on mammogram.  Left axilla was evaluated with ultrasound showing no enlarged or morphologically abnormal lymph  nodes.  IMPRESSION: Irregular fungating mass within the retroareolar left breast, extending to the upper inner quadrant of the left breast, measuring at least 5 cm greatest dimension based on the mammogram, with obvious skin involvement. Ultrasound-guided biopsy is recommended.   ASSESSMENT & PLAN:  82 year old Caucasian female, with past medical history of arthritis, fall, wheelchair bound, presented with a large left breast mass.  1. Central portion of left female breast, invasive lobular carcinoma, pT4N1aM0, stage IIIB, ER+/PR+/HER2- -I discussed her surgical pathology findings with patient and her son Brooke Perkins in details. -Her breast cancer was complete resected, with close margin. All of the 2 lymph nodes 1 positive. She has stage III disease. -Given her advanced age, I'll skip her staging CT and bone scan -Doing the locally advanced disease, and the strong ER/PR positivity, I recommend her to try anastrozole to reduce her risk of recurrence --The potential benefit and side effects, which includes but not limited to, hot flash, skin and vaginal dryness, metabolic changes ( increased blood glucose, cholesterol, weight, etc.), slightly in increased risk of cardiovascular disease, cataracts, muscular and joint discomfort, osteopenia and osteoporosis, etc, were discussed with her in great details. Both pt and her son agrees with the plan. I gave her the prescription to her son, her rehab facility will fill it and she will start in the next few days -The surgical wound has healed completely. -Given her advanced age, adjuvant radiation was not recommended -We discussed breast cancer surveillance after her surgery, if she is able to, we would consider annual screening mammogram of her right breast, and follow up with [place] regularly for lab and exam.   2. Arthritis and history of fall -She uses a wheelchair -Tylenol and tramadol as needed for pain control  3. Weight loss and malnutrition -I  encouraged her to take nutrition supplement.  Plan -She will start anastrozole 1 mg daily in the next few days -I'll see her back in 6 weeks with lab   All questions were answered. The patient knows to call the clinic with any problems, questions or concerns. No barriers to learning was detected.  I spent 20 minutes counseling the patient face to face. The total time spent in the appointment was 25 minutes and more than 50% was on counseling and review of test results     Truitt Merle, MD 03/09/16

## 2016-03-09 NOTE — Telephone Encounter (Signed)
Avs report and appointment schd given per 03/09/16 los. °

## 2016-03-10 DIAGNOSIS — D649 Anemia, unspecified: Secondary | ICD-10-CM | POA: Diagnosis not present

## 2016-03-10 LAB — CBC AND DIFFERENTIAL
HCT: 38 % (ref 36–46)
HEMOGLOBIN: 12.1 g/dL (ref 12.0–16.0)
NEUTROS ABS: 3 /uL
PLATELETS: 166 10*3/uL (ref 150–399)
WBC: 5.2 10*3/mL

## 2016-03-11 ENCOUNTER — Other Ambulatory Visit: Payer: Self-pay | Admitting: *Deleted

## 2016-03-11 DIAGNOSIS — C50112 Malignant neoplasm of central portion of left female breast: Secondary | ICD-10-CM

## 2016-03-12 ENCOUNTER — Non-Acute Institutional Stay (SKILLED_NURSING_FACILITY): Payer: Medicare Other | Admitting: Adult Health

## 2016-03-12 ENCOUNTER — Encounter: Payer: Self-pay | Admitting: Adult Health

## 2016-03-12 DIAGNOSIS — C50912 Malignant neoplasm of unspecified site of left female breast: Secondary | ICD-10-CM

## 2016-03-12 DIAGNOSIS — M810 Age-related osteoporosis without current pathological fracture: Secondary | ICD-10-CM | POA: Diagnosis not present

## 2016-03-12 DIAGNOSIS — K5901 Slow transit constipation: Secondary | ICD-10-CM | POA: Diagnosis not present

## 2016-03-12 DIAGNOSIS — E559 Vitamin D deficiency, unspecified: Secondary | ICD-10-CM | POA: Diagnosis not present

## 2016-03-12 DIAGNOSIS — R5381 Other malaise: Secondary | ICD-10-CM | POA: Diagnosis not present

## 2016-03-12 DIAGNOSIS — E46 Unspecified protein-calorie malnutrition: Secondary | ICD-10-CM

## 2016-03-12 NOTE — Progress Notes (Signed)
Patient ID: Brooke Perkins, female   DOB: [birth year], 82 y.o.   MRN: [id number]    DATE:  03/12/2016   MRN:  [id number]  BIRTHDAY: 1917-04-01  Facility:  Nursing Home Location:  [facility] Room Number: 908-P  LEVEL OF CARE:  SNF (31)  Contact Information    Name Relation Home Work Mobile   [place] Son [phone]         Code Status History    Date Active Date Inactive Code Status Order ID Comments User Context   01/21/2016  2:58 PM 01/22/2016  2:56 PM Partial Code [id number]  Excell Seltzer, MD Inpatient   11/16/2015  9:55 PM 11/18/2015  9:05 PM Full Code [id number]  Ivor Costa, MD ED    Questions for Most Recent Historical Code Status (Order [id number])    Question Answer Comment   In the event of cardiac or respiratory ARREST: Initiate Code Blue, Call Rapid Response No    In the event of cardiac or respiratory ARREST: Perform CPR No    In the event of cardiac or respiratory ARREST: Perform Intubation/Mechanical Ventilation No    In the event of cardiac or respiratory ARREST: Use NIPPV/BiPAp only if indicated No    In the event of cardiac or respiratory ARREST: Administer ACLS medications if indicated Yes    In the event of cardiac or respiratory ARREST: Perform Defibrillation or Cardioversion if indicated Yes         Advance Directive Documentation   Flowsheet Row Most Recent Value  Type of Advance Directive  Out of facility DNR (pink MOST or yellow form)  Pre-existing out of facility DNR order (yellow form or pink MOST form)  No data  "MOST" Form in Place?  No data       Chief Complaint  Patient presents with  . Medical Management of Chronic Issues    HISTORY OF PRESENT ILLNESS:  This is a 82 year old female who is being seen for a routine visit.   She had just finished antibiotic (Keflex)  for her left mastectomy. Site is dry and no erythema.   She has been re-ad[facility] to [facility] on 01/22/16 from the hospital with  left breast cancer for which she had left breast mastectomy on 01/21/16.  She has been admitted for a short-term rehabilitation.   PAST MEDICAL HISTORY:  Past Medical History:  Diagnosis Date  . Arthritis   . Breast cancer, left breast ([place])     invasive ductal carcinoma with erosion through the skin and a central ulceration lateral to the nipple measuring about 4 cm in diameter/notes 01/21/2016  . Complication of anesthesia   . Fall   . History of hiatal hernia   . PONV (postoperative nausea and vomiting)   . Protein calorie malnutrition ([place])      CURRENT MEDICATIONS: Reviewed  Patient's Medications  New Prescriptions   No medications on file  Previous Medications   ANASTROZOLE (ARIMIDEX) 1 MG TABLET    Take 1 mg by mouth daily.   CALCITONIN, SALMON, (MIACALCIN/FORTICAL) 200 UNIT/ACT NASAL SPRAY    Place 1 spray into alternate nostrils daily.    CEPHALEXIN (KEFLEX) 500 MG CAPSULE    Take 500 mg by mouth 3 (three) times daily.   GLUCOSAMINE-CHONDROITIN 500-400 MG TABLET    Take 1 tablet by mouth 3 (three) times daily.   POLYETHYLENE GLYCOL (MIRALAX / GLYCOLAX) PACKET    Take 17 g by mouth daily.   SENNA-DOCUSATE (SENOKOT-S) 8.6-50  MG TABLET    Take 1 tablet by mouth 2 (two) times daily.   TRAMADOL (ULTRAM) 50 MG TABLET    Take one tablet by mouth every 6 hours as needed for moderate pain; Take two tablets by mouth every 6 hours as needed for severe pain.   UNABLE TO FIND    Med Name: Med pass 120 mL BID between meals   VITAMIN B-12 (CYANOCOBALAMIN) 100 MCG TABLET    Take 100 mcg by mouth daily.   VITAMIN C (ASCORBIC ACID) 500 MG TABLET    Take 500 mg by mouth daily.   VITAMIN D, ERGOCALCIFEROL, (DRISDOL) 50000 UNITS CAPS CAPSULE    Take 50,000 Units by mouth every 7 (seven) days.  Modified Medications   No medications on file  Discontinued Medications   ACETAMINOPHEN (TYLENOL) 650 MG CR TABLET    Take 650 mg by mouth every 8 (eight) hours as needed for pain. Reported on 12/22/2015      Allergies  Allergen Reactions  . No Known Allergies      REVIEW OF SYSTEMS:  GENERAL: no change in appetite, no fatigue, no weight changes, no fever, chills or weakness EYES: Denies change in vision, dry eyes, eye pain, itching or discharge EARS: Denies change in hearing, ringing in ears, or earache NOSE: Denies nasal congestion or epistaxis MOUTH and THROAT: Denies oral discomfort, gingival pain or bleeding, pain from teeth or hoarseness   RESPIRATORY: no cough, SOB, DOE, wheezing, hemoptysis CARDIAC: no chest pain, edema or palpitations GI: no abdominal pain, diarrhea, constipation, heart burn, nausea or vomiting GU: Denies dysuria, frequency, hematuria, incontinence, or discharge PSYCHIATRIC: Denies feeling of depression or anxiety. No report of hallucinations, insomnia, paranoia, or agitation   PHYSICAL EXAMINATION  GENERAL APPEARANCE:  In no acute distress.  SKIN:  Left breast mastectomy surgical site has steri-strips, dry and no erythema HEAD: Normal in size and contour. No evidence of trauma EYES: Lids open and close normally. No blepharitis, entropion or ectropion. PERRL. Conjunctivae are clear and sclerae are white. Lenses are without opacity EARS: Pinnae are normal. Patient hears normal voice tunes of the examiner MOUTH and THROAT: Lips are without lesions. Oral mucosa is moist and without lesions. Tongue is normal in shape, size, and color and without lesions NECK: supple, trachea midline, no neck masses, no thyroid tenderness, no thyromegaly LYMPHATICS: no LAN in the neck, no supraclavicular LAN RESPIRATORY: breathing is even & unlabored, BS CTAB CARDIAC: RRR, no murmur,no extra heart sounds, no edema GI: abdomen soft, normal BS, no masses, no tenderness, no hepatomegaly, no splenomegaly EXTREMITIES:  Able to move X 4 extremities PSYCHIATRIC: Alert and oriented X 3. Affect and behavior are appropriate  LABS/RADIOLOGY: Labs reviewed: Basic Metabolic  Panel:  Recent Labs  11/17/15 0418  11/18/15 0410 12/11/15 0037 01/07/16 01/21/16 1611 01/22/16 0241 02/17/16  NA  --   < > 140 142 146  --  140 144  K  --   < > 3.3* 3.2* 3.6  --  3.6 3.5  CL  --   < > 109 109  --   --  104  --   CO2  --   < > 28 28  --   --  30  --   GLUCOSE  --   < > 98 101*  --   --  99  --   BUN  --   < > 19 18 20   --  19 17  CREATININE  --   < >  0.43* 0.30* 0.5 0.43* 0.49 0.4*  CALCIUM  --   < > 8.3* 8.8*  --   --  9.1  --   MG 1.9  --   --   --   --   --   --   --   < > = values in this interval not displayed. Liver Function Tests:  Recent Labs  11/16/15 1659 12/11/15 0037 01/07/16  AST 44* 16 13  ALT 26 11* 7  ALKPHOS 52 88 54  BILITOT 1.2 0.8  --   PROT 6.5 5.7*  --   ALBUMIN 3.8 3.4*  --    CBC:  Recent Labs  12/11/15 0037  01/21/16 1611 01/22/16 0241 02/17/16 03/10/16  WBC 5.6  < > 9.9 7.5 4.9 5.2  NEUTROABS 3.6  --   --   --  3 3  HGB 12.7  < > 13.1 11.0* 10.9* 12.1  HCT 39.6  < > 41.8 34.1* 34* 38  MCV 102.9*  --  101.5* 99.1  --   --   PLT 149*  < > 191 179 163 166  < > = values in this interval not displayed.  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 - wound is healed; follow-up with surgery; continue tramadol 50 mg 1-2 tabs by mouth every 6 hours when necessary  for pain; recently started on Anastrozole 1 mg PO daily  Osteoporosis - continue Miacalcin 200 units 1 spray into left knee area every other day and 1 spray into a right nare every other day and vitamin D3 50,000 units by mouth every Fridays  Protein calorie malnutrition - albumin 3.4; continue med Pass 120 mL by mouth twice a day; latest weight 90.6 lbs, stable  Constipation - continue senna S 8.6-50 mg 1 tab by mouth twice a day  Anemia of chronic disease - resolved Lab Results   Component Value Date   WBC 5.2 03/10/2016   HGB 12.1 03/10/2016   HCT 38 03/10/2016   MCV 99.1 01/22/2016   PLT 166 03/10/2016   Vitamin D deficiency - continue vitamin D3 50,000 units by mouth every Fridays   Thrombocytopenia - platelet  166, resolved      Goals of care:  Short-term rehabilitation    Durenda Age, NP [facility] [phone]

## 2016-03-16 ENCOUNTER — Telehealth: Payer: Self-pay | Admitting: Hematology

## 2016-03-16 NOTE — Telephone Encounter (Signed)
Returned call to Janett Billow from [facility] in regards to a patient's schedule. Left a message to return call.

## 2016-03-22 DIAGNOSIS — M6281 Muscle weakness (generalized): Secondary | ICD-10-CM | POA: Diagnosis not present

## 2016-03-23 DIAGNOSIS — M6281 Muscle weakness (generalized): Secondary | ICD-10-CM | POA: Diagnosis not present

## 2016-03-24 DIAGNOSIS — M6281 Muscle weakness (generalized): Secondary | ICD-10-CM | POA: Diagnosis not present

## 2016-03-25 DIAGNOSIS — M6281 Muscle weakness (generalized): Secondary | ICD-10-CM | POA: Diagnosis not present

## 2016-03-29 ENCOUNTER — Telehealth: Payer: Self-pay | Admitting: *Deleted

## 2016-03-29 DIAGNOSIS — M6281 Muscle weakness (generalized): Secondary | ICD-10-CM | POA: Diagnosis not present

## 2016-03-29 NOTE — Telephone Encounter (Signed)
Spoke with Quillian Quince, nurse at [facility], taking care of [employer].  Quillian Quince states patient has not mentioned to him that she has any nausea.  He will assess this.  Gave him order for zofran from Dr. Burr Medico.  He has our direct number for problems.

## 2016-03-29 NOTE — Telephone Encounter (Signed)
Please contact [place] and let her nurse know we can call in zofran 4mg  1-2 tab q8h as needed for nausea if needed. Thanks   Truitt Merle MD

## 2016-03-29 NOTE — Telephone Encounter (Signed)
Call from pt's son reporting she is having nausea. He thinks this may be related to her Anastrozole. Pt is a resident at [facility] and he has made the nurse aware of her nausea.  Message forwarded to collaborative RN for follow up.

## 2016-03-30 DIAGNOSIS — M6281 Muscle weakness (generalized): Secondary | ICD-10-CM | POA: Diagnosis not present

## 2016-03-31 DIAGNOSIS — M6281 Muscle weakness (generalized): Secondary | ICD-10-CM | POA: Diagnosis not present

## 2016-04-01 DIAGNOSIS — M6281 Muscle weakness (generalized): Secondary | ICD-10-CM | POA: Diagnosis not present

## 2016-04-02 DIAGNOSIS — M6281 Muscle weakness (generalized): Secondary | ICD-10-CM | POA: Diagnosis not present

## 2016-04-05 DIAGNOSIS — M6281 Muscle weakness (generalized): Secondary | ICD-10-CM | POA: Diagnosis not present

## 2016-04-06 ENCOUNTER — Encounter: Payer: Self-pay | Admitting: Adult Health

## 2016-04-06 ENCOUNTER — Non-Acute Institutional Stay (SKILLED_NURSING_FACILITY): Payer: Medicare Other | Admitting: Adult Health

## 2016-04-06 DIAGNOSIS — E441 Mild protein-calorie malnutrition: Secondary | ICD-10-CM

## 2016-04-06 DIAGNOSIS — E559 Vitamin D deficiency, unspecified: Secondary | ICD-10-CM

## 2016-04-06 DIAGNOSIS — K5901 Slow transit constipation: Secondary | ICD-10-CM | POA: Diagnosis not present

## 2016-04-06 DIAGNOSIS — M810 Age-related osteoporosis without current pathological fracture: Secondary | ICD-10-CM

## 2016-04-06 DIAGNOSIS — R5381 Other malaise: Secondary | ICD-10-CM

## 2016-04-06 DIAGNOSIS — M6281 Muscle weakness (generalized): Secondary | ICD-10-CM | POA: Diagnosis not present

## 2016-04-06 DIAGNOSIS — C50912 Malignant neoplasm of unspecified site of left female breast: Secondary | ICD-10-CM | POA: Diagnosis not present

## 2016-04-06 NOTE — Progress Notes (Signed)
Patient ID: Brooke Perkins, female   DOB: March 09, 1917, 82 y.o.   MRN: [id number]    DATE:    04/06/16  MRN:  [id number]  BIRTHDAY: 11-Mar-1917  Facility:  [facility]urs[facility] Home Location:  [place] [facility] [place] Room Number: 908-P  LEVEL OF CARE:  SNF (31)  Contact Information    Name Relation Home Work Mobile   [place] Son [phone]         Code Status History    Date Active Date Inactive Code Status Order ID Comments User Context   01/21/2016  2:58 PM 01/22/2016  2:56 PM Partial Code [id number]  Excell Seltzer, MD Inpatient   11/16/2015  9:55 PM 11/18/2015  9:05 PM Full Code [id number]  Ivor Costa, MD ED    Questions for Most Recent Historical Code Status (Order [id number])    Question Answer Comment   In the event of cardiac or respiratory ARREST: Initiate Code Blue, Call Rapid Response No    In the event of cardiac or respiratory ARREST: Perform CPR No    In the event of cardiac or respiratory ARREST: Perform Intubation/Mechanical Ventilation No    In the event of cardiac or respiratory ARREST: Use NIPPV/BiPAp only if indicated No    In the event of cardiac or respiratory ARREST: Administer ACLS medications if indicated Yes    In the event of cardiac or respiratory ARREST: Perform Defibrillation or Cardioversion if indicated Yes         Advance Directive Documentation   Flowsheet Row Most Recent Value  Type of Advance Directive  Out of facility DNR (pink MOST or yellow form)  Pre-existing out of facility DNR order (yellow form or pink MOST form)  No data  "MOST" Form in Place?  No data       Chief Complaint  Patient presents with  . Medical Management of Chronic Issues    HISTORY OF PRESENT ILLNESS:  This is a 82 year old female who is being seen for a routine visit. Latest platelet is 166, normal now. Thrombocytopenia is now resolved. Latest weight is 89.8, -1 lb from last month. She is currently having short-term rehabilitation.  She has been  re-admitted to [facility] on 01/22/16 from the hospital with left breast cancer for which she had left breast mastectomy on 01/21/16.   PAST MEDICAL HISTORY:  Past Medical History:  Diagnosis Date  . Arthritis   . Breast cancer, left breast ([place])     invasive ductal carcinoma with erosion through the skin and a central ulceration lateral to the nipple measuring about 4 cm in diameter/notes 01/21/2016  . Complication of anesthesia   . Fall   . History of hiatal hernia   . PONV (postoperative nausea and vomiting)   . Protein calorie malnutrition ([place])      CURRENT MEDICATIONS: Reviewed  Patient's Medications  New Prescriptions   No medications on file  Previous Medications   ANASTROZOLE (ARIMIDEX) 1 MG TABLET    Take 1 mg by mouth daily.   CALCITONIN, SALMON, (MIACALCIN/FORTICAL) 200 UNIT/ACT NASAL SPRAY    Place 1 spray into alternate nostrils daily.    GLUCOSAMINE-CHONDROITIN 500-400 MG TABLET    Take 1 tablet by mouth 3 (three) times daily.   MENTHOL (ICY HOT) 5 % PTCH    Apply 1 each topically. Apply to lower back QAM and remove QHS.  Monitor skin for rash/irritation   ONDANSETRON (ZOFRAN) 4 MG TABLET    Take 4-8 mg by mouth every  8 (eight) hours as needed for nausea or vomiting.   POLYETHYLENE GLYCOL (MIRALAX / GLYCOLAX) PACKET    Take 17 g by mouth daily.   SENNA-DOCUSATE (SENOKOT-S) 8.6-50 MG TABLET    Take 1 tablet by mouth 2 (two) times daily.   TRAMADOL (ULTRAM) 50 MG TABLET    Take 50-100 mg by mouth every 8 (eight) hours as needed for moderate pain or severe pain.   TRAMADOL (ULTRAM) 50 MG TABLET    Take 100 mg by mouth daily. Take at [place] Name: Med pass 120 mL BID between meals   VITAMIN B-12 (CYANOCOBALAMIN) 100 MCG TABLET    Take 100 mcg by mouth daily.   VITAMIN C (ASCORBIC ACID) 500 MG TABLET    Take 500 mg by mouth daily.   VITAMIN D, ERGOCALCIFEROL, (DRISDOL) 50000 UNITS CAPS CAPSULE    Take 50,000 Units by mouth every 7 (seven) days.   Modified Medications   No medications on file  Discontinued Medications   CEPHALEXIN (KEFLEX) 500 MG CAPSULE    Take 500 mg by mouth 3 (three) times daily.   TRAMADOL (ULTRAM) 50 MG TABLET    Take one tablet by mouth every 6 hours as needed for moderate pain; Take two tablets by mouth every 6 hours as needed for severe pain.     Allergies  Allergen Reactions  . No Known Allergies      REVIEW OF SYSTEMS:  GENERAL: no change in appetite, no fatigue, no weight changes, no fever, chills or weakness EYES: Denies change in vision, dry eyes, eye pain, itching or discharge EARS: Denies change in hearing, ringing in ears, or earache NOSE: Denies nasal congestion or epistaxis MOUTH and THROAT: Denies oral discomfort, gingival pain or bleeding, pain from teeth or hoarseness   RESPIRATORY: no cough, SOB, DOE, wheezing, hemoptysis CARDIAC: no chest pain, edema or palpitations GI: no abdominal pain, diarrhea, constipation, heart burn, nausea or vomiting GU: Denies dysuria, frequency, hematuria, incontinence, or discharge PSYCHIATRIC: Denies feeling of depression or anxiety. No report of hallucinations, insomnia, paranoia, or agitation   PHYSICAL EXAMINATION  GENERAL APPEARANCE:  In no acute distress.  SKIN:  Left breast mastectomy surgical site is healed HEAD: Normal in size and contour. No evidence of trauma EYES: Lids open and close normally. No blepharitis, entropion or ectropion. PERRL. Conjunctivae are clear and sclerae are white. Lenses are without opacity EARS: Pinnae are normal. Patient hears normal voice tunes of the examiner MOUTH and THROAT: Lips are without lesions. Oral mucosa is moist and without lesions. Tongue is normal in shape, size, and color and without lesions NECK: supple, trachea midline, no neck masses, no thyroid tenderness, no thyromegaly LYMPHATICS: no LAN in the neck, no supraclavicular LAN RESPIRATORY: breathing is even & unlabored, BS CTAB CARDIAC: RRR, no  murmur,no extra heart sounds, no edema GI: abdomen soft, normal BS, no masses, no tenderness, no hepatomegaly, no splenomegaly EXTREMITIES:  Able to move X 4 extremities PSYCHIATRIC: Alert to self, disoriented to time and place. Affect and behavior are appropriate  LABS/RADIOLOGY: Labs reviewed: Basic Metabolic Panel:  Recent Labs  11/17/15 0418  11/18/15 0410 12/11/15 0037  01/22/16 0241 02/17/16   NA  --   140 142  140 144   K  --   3.3* 3.2*  3.6 3.5   CL  --   109 109  104  --    CO2  --   28 28  30   --  GLUCOSE  --   98 101*  99  --    BUN  --   19 18  19 17    CREATININE  --   0.43* 0.30*  0.49 0.4*   CALCIUM  --   8.3* 8.8*  9.1  --    MG 1.9   --   --    --   --    < > = values in this interval not displayed. Liver Function Tests:  Recent Labs  11/16/15 1659 12/11/15 0037 01/07/16   AST 44* 16 13   ALT 26 11* 7   ALKPHOS 52 88 54   BILITOT 1.2 0.8  --    PROT 6.5 5.7*  --    ALBUMIN 3.8 3.4*  --     CBC:  Recent Labs  01/21/16 1611 01/22/16 0241 02/17/16 03/10/16   WBC 9.9 7.5 4.9 5.2   NEUTROABS  --   --  3 3   HGB 13.1 11.0* 10.9* 12.1   HCT 41.8 34.1* 34* 38   MCV 101.5* 99.1  --   --    PLT 191 179 163 166    Cardiac Enzymes:  Recent Labs  11/16/15 1659  CKTOTAL 551*     ASSESSMENT/PLAN:   Physical deconditioning - Continue rehabilitation, PT for therapeutic strengthening exercises ; fall precaution; physiatry consult was recently ordered  Left breast invasive ductal carcinoma S/P mastectomy - wound is healed; follow-up with surgery; continue tramadol 50 mg 1-2 tabs by mouth every 6 hours when necessary  for pain; continue Anastrozole 1 mg PO daily  Osteoporosis - continue Miacalcin 200 units 1 spray into left knee area every other day and 1 spray into a right nare every other day and vitamin D3 50,000 units by mouth every Fridays  Protein calorie malnutrition - albumin 3.4; continue med Pass 120 mL by mouth twice a day  Constipation  - continue Miralax 17 gm PO Q D and Senna-S 1 tab PO BID  Vitamin D deficiency - continue vitamin D3 50,000 units by mouth every Fridays      Goals of care:  Short-term rehabilitation    Durenda Age, NP [facility] [phone]

## 2016-04-07 DIAGNOSIS — M545 Low back pain: Secondary | ICD-10-CM | POA: Diagnosis not present

## 2016-04-07 DIAGNOSIS — M6281 Muscle weakness (generalized): Secondary | ICD-10-CM | POA: Diagnosis not present

## 2016-04-07 DIAGNOSIS — R2681 Unsteadiness on feet: Secondary | ICD-10-CM | POA: Diagnosis not present

## 2016-04-08 DIAGNOSIS — M6281 Muscle weakness (generalized): Secondary | ICD-10-CM | POA: Diagnosis not present

## 2016-04-09 DIAGNOSIS — M6281 Muscle weakness (generalized): Secondary | ICD-10-CM | POA: Diagnosis not present

## 2016-04-12 DIAGNOSIS — M6281 Muscle weakness (generalized): Secondary | ICD-10-CM | POA: Diagnosis not present

## 2016-04-13 DIAGNOSIS — M6281 Muscle weakness (generalized): Secondary | ICD-10-CM | POA: Diagnosis not present

## 2016-04-14 DIAGNOSIS — M6281 Muscle weakness (generalized): Secondary | ICD-10-CM | POA: Diagnosis not present

## 2016-04-15 DIAGNOSIS — M6281 Muscle weakness (generalized): Secondary | ICD-10-CM | POA: Diagnosis not present

## 2016-04-16 DIAGNOSIS — M6281 Muscle weakness (generalized): Secondary | ICD-10-CM | POA: Diagnosis not present

## 2016-04-19 DIAGNOSIS — M6281 Muscle weakness (generalized): Secondary | ICD-10-CM | POA: Diagnosis not present

## 2016-04-20 DIAGNOSIS — M6281 Muscle weakness (generalized): Secondary | ICD-10-CM | POA: Diagnosis not present

## 2016-04-21 DIAGNOSIS — M6281 Muscle weakness (generalized): Secondary | ICD-10-CM | POA: Diagnosis not present

## 2016-04-22 DIAGNOSIS — R2681 Unsteadiness on feet: Secondary | ICD-10-CM | POA: Diagnosis not present

## 2016-04-22 DIAGNOSIS — M6281 Muscle weakness (generalized): Secondary | ICD-10-CM | POA: Diagnosis not present

## 2016-04-22 DIAGNOSIS — M545 Low back pain: Secondary | ICD-10-CM | POA: Diagnosis not present

## 2016-04-23 DIAGNOSIS — M6281 Muscle weakness (generalized): Secondary | ICD-10-CM | POA: Diagnosis not present

## 2016-04-23 DIAGNOSIS — M545 Low back pain: Secondary | ICD-10-CM | POA: Diagnosis not present

## 2016-04-23 DIAGNOSIS — R2681 Unsteadiness on feet: Secondary | ICD-10-CM | POA: Diagnosis not present

## 2016-04-26 DIAGNOSIS — M545 Low back pain: Secondary | ICD-10-CM | POA: Diagnosis not present

## 2016-04-26 DIAGNOSIS — M6281 Muscle weakness (generalized): Secondary | ICD-10-CM | POA: Diagnosis not present

## 2016-04-26 DIAGNOSIS — R2681 Unsteadiness on feet: Secondary | ICD-10-CM | POA: Diagnosis not present

## 2016-04-27 ENCOUNTER — Other Ambulatory Visit (HOSPITAL_BASED_OUTPATIENT_CLINIC_OR_DEPARTMENT_OTHER): Payer: Medicare Other

## 2016-04-27 ENCOUNTER — Ambulatory Visit (HOSPITAL_BASED_OUTPATIENT_CLINIC_OR_DEPARTMENT_OTHER): Payer: Medicare Other | Admitting: Hematology

## 2016-04-27 ENCOUNTER — Telehealth: Payer: Self-pay | Admitting: Hematology

## 2016-04-27 ENCOUNTER — Encounter: Payer: Self-pay | Admitting: Hematology

## 2016-04-27 VITALS — BP 133/70 | HR 78 | Temp 98.2°F | Resp 16 | Ht 65.0 in | Wt 89.9 lb

## 2016-04-27 DIAGNOSIS — C50112 Malignant neoplasm of central portion of left female breast: Secondary | ICD-10-CM | POA: Diagnosis not present

## 2016-04-27 DIAGNOSIS — E46 Unspecified protein-calorie malnutrition: Secondary | ICD-10-CM

## 2016-04-27 DIAGNOSIS — Z170 Estrogen receptor positive status [ER+]: Secondary | ICD-10-CM | POA: Diagnosis not present

## 2016-04-27 DIAGNOSIS — M1990 Unspecified osteoarthritis, unspecified site: Secondary | ICD-10-CM

## 2016-04-27 DIAGNOSIS — Z9181 History of falling: Secondary | ICD-10-CM

## 2016-04-27 DIAGNOSIS — M6281 Muscle weakness (generalized): Secondary | ICD-10-CM | POA: Diagnosis not present

## 2016-04-27 LAB — COMPREHENSIVE METABOLIC PANEL
ALT: 10 U/L (ref 0–55)
ANION GAP: 8 meq/L (ref 3–11)
AST: 14 U/L (ref 5–34)
Albumin: 3.3 g/dL — ABNORMAL LOW (ref 3.5–5.0)
Alkaline Phosphatase: 80 U/L (ref 40–150)
BUN: 24.4 mg/dL (ref 7.0–26.0)
CHLORIDE: 105 meq/L (ref 98–109)
CO2: 28 meq/L (ref 22–29)
CREATININE: 0.7 mg/dL (ref 0.6–1.1)
Calcium: 9.3 mg/dL (ref 8.4–10.4)
EGFR: 74 mL/min/{1.73_m2} — ABNORMAL LOW (ref >90)
Glucose: 100 mg/dl (ref 70–140)
POTASSIUM: 4.3 meq/L (ref 3.5–5.1)
Sodium: 141 mEq/L (ref 136–145)
Total Bilirubin: 0.56 mg/dL (ref 0.20–1.20)
Total Protein: 6.6 g/dL (ref 6.4–8.3)

## 2016-04-27 LAB — CBC WITH DIFFERENTIAL/PLATELET
BASO%: 0.5 % (ref 0.0–2.0)
Basophils Absolute: 0 10*3/uL (ref 0.0–0.1)
EOS%: 3.3 % (ref 0.0–7.0)
Eosinophils Absolute: 0.2 10*3/uL (ref 0.0–0.5)
HCT: 42.5 % (ref 34.8–46.6)
HGB: 13.7 g/dL (ref 11.6–15.9)
LYMPH%: 19.9 % (ref 14.0–49.7)
MCH: 30.7 pg (ref 25.1–34.0)
MCHC: 32.2 g/dL (ref 31.5–36.0)
MCV: 95.4 fL (ref 79.5–101.0)
MONO#: 0.4 10*3/uL (ref 0.1–0.9)
MONO%: 6.4 % (ref 0.0–14.0)
NEUT#: 4.8 10*3/uL (ref 1.5–6.5)
NEUT%: 69.9 % (ref 38.4–76.8)
PLATELETS: 177 10*3/uL (ref 145–400)
RBC: 4.46 10*6/uL (ref 3.70–5.45)
RDW: 17 % — ABNORMAL HIGH (ref 11.2–14.5)
WBC: 6.8 10*3/uL (ref 3.9–10.3)
lymph#: 1.4 10*3/uL (ref 0.9–3.3)

## 2016-04-27 NOTE — Progress Notes (Signed)
[facility]  [phone] [phone]  Clinic Follow up Note   Patient Care Team: Deland Pretty, MD as PCP - General (Internal Medicine) 04/27/2016  SUMMARY OF ONCOLOGIC HISTORY: Oncology History   Cancer of central portion of left female breast [facility])   Staging form: Breast, AJCC 7th Edition   - Clinical stage from 12/05/2015: Stage IIB (T3, N0, M0) - Signed by Truitt Merle, MD on 12/22/2015   - Pathologic stage from 01/21/2016: Stage IIIB (T4b, N1a, cM0) - Signed by Truitt Merle, MD on 02/18/2016       Cancer of central portion of left female breast ([place])   12/05/2015 Initial Diagnosis    Cancer of central portion of left female breast ([place])      12/05/2015 Mammogram    Your irregular fungating mass within the retroviral left breast, extending to the upper inner quadrant of the left breast, at least a 5 cm in greatest dimension, with obvious skin involvement, left axillary nodes were negative by ultrasound.      12/05/2015 Initial Biopsy    Left member breast mass biopsy showed invasive lobular carcinoma      12/05/2015 Receptors her2    ER 95% positive, PR 95% positive, HER-2 negative, Ki-67 12%,      01/21/2016 Surgery    Left mastectomy and SLN biopsy       01/21/2016 Pathology Results    Left mastectomy showed G2 invasive lobular carcinoma with skin involvement, at least two foci, the larger spans 5cm, (+) LVI, posterior margin broadly <0.1cm, 1 out of 2 nodes positive for cancer       03/12/2016 -  Anti-estrogen oral therapy    Anastrozole 1 mg daily      History of present illness (11/17/2015):  Ms Maddux is a 82 year old Caucasian female, with past medical history of hearing loss, arthritis, presented to the emergency room after a fall at home. She lives alone. She also has semi-dysuria, no fever at home. In the ER, urine test was positive for UTI. She was also found to have a left breast mass, which she reports it has been there for the past 2-3  months. I was called to evaluate her left breast mass. She denies significant pain in the left breast, no discharge.   She has a son who lives in [place]. She has not told anybody about that her left breast mass, she is not ready to discuss this with her son yet. She states " I do not want to fight, I'm 82 year old." She lives independently, able to take care of herself and function well at home.   CURRENT THERAPY:  anastrozole 25m daily, started on 03/12/2016  INTERVAL HISTORY: FKeylareturns for follow-up. She is accompanied by her son JJacqulynn [facility] clinic today.    Patient reports she does better on some days than others. She uses a walker occasionally, her son notes with someone else also holding her. She uses a wheelchair most times. Years ago she fell on ice and hit her tailbone, this pain continues to hurt her sometimes. It hurts for her to sleep on her back. She places a patch on her back which helps. She also reports right shoulder pain when she sleeps on her back.  She is taking anastrozole without issue. She denies hot flashes. She used to have headaches but has not had any since menopause. She denies any pain in her breast.    REVIEW OF SYSTEMS:   Constitutional: Denies fevers, chills  Eyes: Denies blurriness of vision Ears, nose, mouth, throat, and face: Denies mucositis or sore throat Respiratory: Denies cough, dyspnea or wheezes Cardiovascular: Denies palpitation, chest discomfort or lower extremity swelling Gastrointestinal:  Denies nausea, heartburn or change in bowel habits Skin: Denies abnormal skin rashes Musculoskeletal: (+) back pain, right shoulder pain Lymphatics: Denies new lymphadenopathy or easy bruising Neurological:Denies numbness, tingling or new weaknesses Behavioral/Psych: Mood is stable, no new changes  All other systems were reviewed with the patient and are negative.  MEDICAL HISTORY:  Past Medical History:  Diagnosis Date  . Arthritis   . Breast  cancer, left breast ([place])     invasive ductal carcinoma with erosion through the skin and a central ulceration lateral to the nipple measuring about 4 cm in diameter/notes 01/21/2016  . Complication of anesthesia   . Fall   . History of hiatal hernia   . PONV (postoperative nausea and vomiting)   . Protein calorie malnutrition ([place])     SURGICAL HISTORY: Past Surgical History:  Procedure Laterality Date  . ABDOMINAL HYSTERECTOMY    . APPENDECTOMY    . CHOLECYSTECTOMY    . EYE SURGERY     cataracts bilateral eyes  . FRACTURE SURGERY Right    wrist surgery, cadaver implant  . MASTECTOMY COMPLETE / SIMPLE Left 01/21/2016  . TONSILLECTOMY    . TOTAL MASTECTOMY Left 01/21/2016   Procedure: LEFT TOTAL MASTECTOMY;  Surgeon: Excell Seltzer, MD;  Location: [facility];  Service: General;  Laterality: Left;    I have reviewed the social history and family history with the patient and they are unchanged from previous note.  ALLERGIES:  is allergic to no known allergies.  MEDICATIONS:  Current Outpatient Prescriptions  Medication Sig Dispense Refill  . anastrozole (ARIMIDEX) 1 MG tablet Take 1 mg by mouth daily.    Marland Kitchen glucosamine-chondroitin 500-400 MG tablet Take 1 tablet by mouth 3 (three) times daily.    . Menthol (ICY HOT) 5 % PTCH Apply topically.    . ondansetron (ZOFRAN) 4 MG tablet Take 4-8 mg by mouth every 8 (eight) hours as needed for nausea or vomiting.    . senna-docusate (SENOKOT-S) 8.6-50 MG tablet Take 1 tablet by mouth 2 (two) times daily.    . traMADol (ULTRAM) 50 MG tablet Take one tablet by mouth every 6 hours as needed for moderate pain; Take two tablets by mouth every 6 hours as needed for severe pain. 240 tablet 0  . vitamin C (ASCORBIC ACID) 500 MG tablet Take 500 mg by mouth daily.    . calcitonin, salmon, (MIACALCIN/FORTICAL) 200 UNIT/ACT nasal spray Place 1 spray into alternate nostrils daily.     . polyethylene glycol (MIRALAX / GLYCOLAX) packet Take 17 g by mouth  daily.    Marland Kitchen UNABLE TO FIND Med Name: Med pass 120 mL BID between meals    . vitamin B-12 (CYANOCOBALAMIN) 100 MCG tablet Take 100 mcg by mouth daily.    . Vitamin D, Ergocalciferol, (DRISDOL) 50000 units CAPS capsule Take 50,000 Units by mouth every 7 (seven) days.     No current facility-administered medications for this visit.     PHYSICAL EXAMINATION: ECOG PERFORMANCE STATUS: 3  Vitals:   04/27/16 1526  BP: 133/70  Pulse: 78  Resp: 16  Temp: 98.2 F (36.8 C)   Filed Weights   04/27/16 1526  Weight: 89 lb 14.4 oz (40.8 kg)    GENERAL:alert, no distress and comfortable. In wheelchair SKIN: skin color, texture, turgor are normal,  no rashes or significant lesions EYES: normal, Conjunctiva are pink and non-injected, sclera clear OROPHARYNX:no exudate, no erythema and lips, buccal mucosa, and tongue normal  NECK: supple, thyroid normal size, non-tender, without nodularity LYMPH:  no palpable lymphadenopathy in the cervical, axillary or inguinal LUNGS: clear to auscultation and percussion with normal breathing effort HEART: regular rate & rhythm and no murmurs and no lower extremity edema ABDOMEN:abdomen soft, non-tender and normal bowel sounds Musculoskeletal:no cyanosis of digits and no clubbing  NEURO: alert & oriented x 3 with fluent speech, no focal motor/sensory deficits Breasts: Left breast mastectomy site is well healed. Left axillary region is clear of abnormality. Right breast and right axilla are without abnormality.   LABORATORY DATA:  I have reviewed the data as listed CBC Latest Ref Rng & Units 04/27/2016 03/10/2016 02/17/2016  WBC 3.9 - 10.3 10e3/uL 6.8 5.2 4.9  Hemoglobin 11.6 - 15.9 g/dL 13.7 12.1 10.9(A)  Hematocrit 34.8 - 46.6 % 42.5 38 34(A)  Platelets 145 - 400 10e3/uL 177 166 163     CMP Latest Ref Rng & Units 04/27/2016 02/17/2016 01/22/2016  Glucose 70 - 140 mg/dl 100 - 99  BUN 7.0 - 26.0 mg/dL 24._0 Creatinine 0.6 - 1.1 mg/dL 0.7 0.4(A) 0.49    Sodium 136 - 145 mEq/L 141 144 140  Potassium 3.5 - 5.1 mEq/L 4.3 3.5 3.6  Chloride 101 - 111 mmol/L - - 104  CO2 22 - 29 mEq/L 28 - 30  Calcium 8.4 - 10.4 mg/dL 9.3 - 9.1  Total Protein 6.4 - 8.3 g/dL 6.6 - -  Total Bilirubin 0.20 - 1.20 mg/dL 0.56 - -  Alkaline Phos 40 - 150 U/L 80 - -  AST 5 - 34 U/L 14 - -  ALT 0 - 55 U/L 10 - -   PATHOLOGY REPORT  Diagnosis 12/05/2015 Breast, left, needle core biopsy, 10:00 o'clock - INVASIVE MAMMARY CARCINOMA. - SEE COMMENT. Microscopic Comment The carcinoma appears grade 1-2. An E-Cadherin stain will be performed and the results reported separately. A breast prognostic profile will be performed and the results reported separately. The results were called to [facility] on 12/08/15. (JBK:ds 12/08/15) By immunohistochemistry, the malignant cells are negative for E-Cadherin, supporting a lobular phenotype. (JBK:ds 12/08/15)  Results: IMMUNOHISTOCHEMICAL AND MORPHOMETRIC ANALYSIS PERFORMED MANUALLY Estrogen Receptor: 95%, POSITIVE, STRONG STAINING INTENSITY Progesterone Receptor: 95%, POSITIVE, STRONG STAINING INTENSITY Proliferation Marker Ki67: 12% FLUORESCENCE IN-SITU HYBRIDIZATION Results: HER2 - NEGATIVE RATIO OF HER2/CEP17 SIGNALS 1.27 AVERAGE HER2 COPY NUMBER PER CELL 2.80  Diagnosis 01/21/2016 Breast, simple mastectomy, Left - INVASIVE LOBULAR CARCINOMA, GRADE II/III, AT LEAST TWO FOCI, THE LARGER SPANS 5.0 CM. - LYMPHOVASCULAR INVASION IS IDENTIFIED. - CARCINOMA INVOLVES THE NIPPLE AND SKIN WITH ULCERATION. - INVASIVE CARCINOMA IS BROADLY LESS THAN 0.1 CM TO THE POSTERIOR MARGIN. - METASTATIC CARCINOMA IN 1 OF 2 LYMPH NODES (1/2). - SEE ONCOLOGY TABLE BELOW. Microscopic Comment BREAST, INVASIVE TUMOR, WITHOUT LYMPH NODES PRESENT Specimen, including laterality : Left breast Procedure: Simple mastectomy Histologic type: Lobular Grade: II Tubule formation: 3 Nuclear pleomorphism: 2 Mitotic: 1 Tumor size  (gross measurement): 5.0 cm Margins: Invasive, distance to closest margin: Broadly less than 0.1 cm to the posterior margin Lymphovascular invasion: Present Ductal carcinoma in situ: Not identified Tumor focality: At least two foci Treatment effect: N/A Extent of tumor: Skin: Involved, with ulceration Nipple: Involved Skeletal muscle: Not identified. Breast prognostic profile: [phone] Estrogen receptor: 95% 1 of 3 FINAL for FINLEY, DINKEL ([id number])  Microscopic Comment(continued) Progesterone receptor: 95% Her 2 neu: No amplification was detected. Ki-67: 12% Non-neoplastic breast: Significant vascular calcifications. TNM: pT4b, pN1a Comments: In addition to the main 5.0 cm tumor, there is similar appearing invasive lobular carcinoma present in random tissue submitted from the inner quadrant of the specimen. An immunohistochemical stain for cytokeratin was used to help evaluate this case. (JBK:kh 01-26-16)  RADIOGRAPHIC STUDIES: I have personally reviewed the radiological images as listed and agreed with the findings in the report.  Diagnostic mammogram and ultrasound bilateral breast 12/05/2015 FINDINGS: There is a large irregular mass within the upper left breast, centered at the 11-12 o'clock axis region, extending from anterior to middle depth, measuring at least 5 cm greatest dimension, with associated microcalcifications, with overlying skin retraction. Bandages overlie the mass, indicating skin involvement.  There are no dominant masses, suspicious calcifications or secondary signs of malignancy within the right breast.  On physical exam, there is a fungating mass within the retroareolar left breast, extending to the upper inner quadrant, with obvious skin involvement.  Targeted ultrasound is performed, showing an irregular hypoechoic mass within the left breast at the 10 o'clock axis, 3 cm from the nipple, with internal vascularity, difficult to  measure definitively due the overlying skin involvement, measuring at least 4.1 x 2 x 3.7 cm, with punctate internal echoes compatible with the associated microcalcifications seen on mammogram.  Left axilla was evaluated with ultrasound showing no enlarged or morphologically abnormal lymph nodes.  IMPRESSION: Irregular fungating mass within the retroareolar left breast, extending to the upper inner quadrant of the left breast, measuring at least 5 cm greatest dimension based on the mammogram, with obvious skin involvement. Ultrasound-guided biopsy is recommended.   ASSESSMENT & PLAN:  82 year old Caucasian female, with past medical history of arthritis, fall, wheelchair bound, presented with a large left breast mass.  1. Central portion of left female breast, invasive lobular carcinoma, pT4N1aM0, stage IIIB, ER+/PR+/HER2- -I discussed lab findings with patient and her son Dellis Filbert -Her breast cancer was complete resected, with close margin. All of the 2 lymph nodes were positive. She has stage III disease. -The surgical wound has healed completely. -She takes anastrozole 1 mg daily without issue, will continue for 5-10 years  -We discussed breast cancer surveillance after her surgery, if she is able to, we would consider annual screening mammogram of her right breast, and follow up with [place] regularly for lab and exam.   2. Arthritis and history of fall -She uses a wheelchair -Tylenol and tramadol as needed for pain control  3. Weight loss and malnutrition -I encouraged her to take nutrition supplement.  Plan -She will continue anastrozole 1 mg daily  -Follow up in 4 months    All questions were answered. The patient knows to call the clinic with any problems, questions or concerns. No barriers to learning was detected.  I spent 20 minutes counseling the patient face to face. The total time spent in the appointment was 25 minutes and more than 50% was on counseling and review  of test results  This document serves as a record of services personally performed by Truitt Merle, MD. It was created on her behalf by Arlyce Harman, a trained medical scribe. The creation of this record is based on the scribe's personal observations and the provider's statements to them. This document has been checked and approved by the attending provider.     Truitt Merle, MD 04/27/16

## 2016-04-27 NOTE — Telephone Encounter (Signed)
Appointments scheduled per 10/31 LOS. Patient given AVS report and calendars of future scheduled appointments.  °

## 2016-04-28 DIAGNOSIS — M6281 Muscle weakness (generalized): Secondary | ICD-10-CM | POA: Diagnosis not present

## 2016-04-29 DIAGNOSIS — M6281 Muscle weakness (generalized): Secondary | ICD-10-CM | POA: Diagnosis not present

## 2016-04-30 DIAGNOSIS — M6281 Muscle weakness (generalized): Secondary | ICD-10-CM | POA: Diagnosis not present

## 2016-05-03 DIAGNOSIS — M6281 Muscle weakness (generalized): Secondary | ICD-10-CM | POA: Diagnosis not present

## 2016-05-04 DIAGNOSIS — M6281 Muscle weakness (generalized): Secondary | ICD-10-CM | POA: Diagnosis not present

## 2016-05-05 DIAGNOSIS — M6281 Muscle weakness (generalized): Secondary | ICD-10-CM | POA: Diagnosis not present

## 2016-05-06 ENCOUNTER — Non-Acute Institutional Stay (SKILLED_NURSING_FACILITY): Payer: Medicare Other | Admitting: Adult Health

## 2016-05-06 ENCOUNTER — Encounter: Payer: Self-pay | Admitting: Adult Health

## 2016-05-06 DIAGNOSIS — R5381 Other malaise: Secondary | ICD-10-CM | POA: Diagnosis not present

## 2016-05-06 DIAGNOSIS — E559 Vitamin D deficiency, unspecified: Secondary | ICD-10-CM

## 2016-05-06 DIAGNOSIS — C50912 Malignant neoplasm of unspecified site of left female breast: Secondary | ICD-10-CM | POA: Diagnosis not present

## 2016-05-06 DIAGNOSIS — M545 Low back pain, unspecified: Secondary | ICD-10-CM

## 2016-05-06 DIAGNOSIS — K5901 Slow transit constipation: Secondary | ICD-10-CM | POA: Diagnosis not present

## 2016-05-06 DIAGNOSIS — E441 Mild protein-calorie malnutrition: Secondary | ICD-10-CM

## 2016-05-06 DIAGNOSIS — M810 Age-related osteoporosis without current pathological fracture: Secondary | ICD-10-CM | POA: Diagnosis not present

## 2016-05-06 NOTE — Progress Notes (Signed)
Patient ID: Brooke Perkins, female   DOB: September 26, 1916, 82 y.o.   MRN: [id number]    DATE:  05/06/2016   MRN:  [id number]  BIRTHDAY: April 16, 1917  Facility:  Nursing Home Location:  [place] Room Number: 908-P  LEVEL OF CARE:  SNF (31)  Contact Information    Name Relation Home Work Mobile   [place] Son [phone]         Code Status History    Date Active Date Inactive Code Status Order ID Comments User Context   01/21/2016  2:58 PM 01/22/2016  2:56 PM Partial Code [id number]  Excell Seltzer, MD Inpatient   11/16/2015  9:55 PM 11/18/2015  9:05 PM Full Code [id number]  Ivor Costa, MD ED    Questions for Most Recent Historical Code Status (Order [id number])    Question Answer Comment   In the event of cardiac or respiratory ARREST: Initiate Code Blue, Call Rapid Response No    In the event of cardiac or respiratory ARREST: Perform CPR No    In the event of cardiac or respiratory ARREST: Perform Intubation/Mechanical Ventilation No    In the event of cardiac or respiratory ARREST: Use NIPPV/BiPAp only if indicated No    In the event of cardiac or respiratory ARREST: Administer ACLS medications if indicated Yes    In the event of cardiac or respiratory ARREST: Perform Defibrillation or Cardioversion if indicated Yes         Advance Directive Documentation   Flowsheet Row Most Recent Value  Type of Advance Directive  Out of facility DNR (pink MOST or yellow form)  Pre-existing out of facility DNR order (yellow form or pink MOST form)  No data  "MOST" Form in Place?  No data       Chief Complaint  Patient presents with  . Medical Management of Chronic Issues    HISTORY OF PRESENT ILLNESS:  This is a 82 year old female who is being seen for a routine visit. She is currently having a short-term rehabilitation @ [facility].  Physiatry consult has been ordered for pain management.  She has been re-admitted to [facility] on  01/22/16 from the hospital with left breast cancer for which she had left breast mastectomy on 01/21/16.  PAST MEDICAL HISTORY:  Past Medical History:  Diagnosis Date  . Arthritis   . Breast cancer, left breast ([place])     invasive ductal carcinoma with erosion through the skin and a central ulceration lateral to the nipple measuring about 4 cm in diameter/notes 01/21/2016  . Complication of anesthesia   . Fall   . History of hiatal hernia   . PONV (postoperative nausea and vomiting)   . Protein calorie malnutrition ([place])      CURRENT MEDICATIONS: Reviewed  Patient's Medications  New Prescriptions   No medications on file  Previous Medications   ANASTROZOLE (ARIMIDEX) 1 MG TABLET    Take 1 mg by mouth daily.   CALCITONIN, SALMON, (MIACALCIN/FORTICAL) 200 UNIT/ACT NASAL SPRAY    Place 1 spray into alternate nostrils daily.    GLUCOSAMINE-CHONDROITIN 500-400 MG TABLET    Take 1 tablet by mouth 3 (three) times daily.   MENTHOL (ICY HOT) 5 % PTCH    Apply 1 each topically. Apply to lower back QAM and remove QHS.  Monitor skin for rash/irritation   ONDANSETRON (ZOFRAN) 4 MG TABLET    Take 4-8 mg by mouth every 8 (eight) hours as needed for nausea or  vomiting.   POLYETHYLENE GLYCOL (MIRALAX / GLYCOLAX) PACKET    Take 17 g by mouth daily.   SENNA-DOCUSATE (SENOKOT-S) 8.6-50 MG TABLET    Take 1 tablet by mouth 2 (two) times daily.   TRAMADOL (ULTRAM) 50 MG TABLET    Take 50-100 mg by mouth every 8 (eight) hours as needed for moderate pain or severe pain.   TRAMADOL (ULTRAM) 50 MG TABLET    Take 100 mg by mouth daily. Take at [place] Name: Med pass 120 mL BID between meals   VITAMIN B-12 (CYANOCOBALAMIN) 100 MCG TABLET    Take 100 mcg by mouth daily.   VITAMIN C (ASCORBIC ACID) 500 MG TABLET    Take 500 mg by mouth daily.   VITAMIN D, ERGOCALCIFEROL, (DRISDOL) 50000 UNITS CAPS CAPSULE    Take 50,000 Units by mouth every 7 (seven) days.  Modified Medications   No medications  on file  Discontinued Medications   TRAMADOL (ULTRAM) 50 MG TABLET    Take one tablet by mouth every 6 hours as needed for moderate pain; Take two tablets by mouth every 6 hours as needed for severe pain.     Allergies  Allergen Reactions  . No Known Allergies      REVIEW OF SYSTEMS:  GENERAL: no change in appetite, no fatigue, no weight changes, no fever, chills or weakness EYES: Denies change in vision, dry eyes, eye pain, itching or discharge EARS: Denies change in hearing, ringing in ears, or earache NOSE: Denies nasal congestion or epistaxis MOUTH and THROAT: Denies oral discomfort, gingival pain or bleeding, pain from teeth or hoarseness   RESPIRATORY: no cough, SOB, DOE, wheezing, hemoptysis CARDIAC: no chest pain, edema or palpitations GI: no abdominal pain, diarrhea, constipation, heart burn, nausea or vomiting GU: Denies dysuria, frequency, hematuria, incontinence, or discharge PSYCHIATRIC: Denies feeling of depression or anxiety. No report of hallucinations, insomnia, paranoia, or agitation   PHYSICAL EXAMINATION  GENERAL APPEARANCE:  In no acute distress.  SKIN:  Left breast mastectomy surgical site is healed HEAD: Normal in size and contour. No evidence of trauma EYES: Lids open and close normally. No blepharitis, entropion or ectropion. PERRL. Conjunctivae are clear and sclerae are white. Lenses are without opacity EARS: Pinnae are normal. Patient hears normal voice tunes of the examiner MOUTH and THROAT: Lips are without lesions. Oral mucosa is moist and without lesions. Tongue is normal in shape, size, and color and without lesions NECK: supple, trachea midline, no neck masses, no thyroid tenderness, no thyromegaly LYMPHATICS: no LAN in the neck, no supraclavicular LAN RESPIRATORY: breathing is even & unlabored, BS CTAB CARDIAC: RRR, no murmur,no extra heart sounds, no edema GI: abdomen soft, normal BS, no masses, no tenderness, no hepatomegaly, no  splenomegaly EXTREMITIES:  Able to move X 4 extremities PSYCHIATRIC: Alert and oriented X 3. Affect and behavior are appropriate  LABS/RADIOLOGY: Labs reviewed: Basic Metabolic Panel:  Recent Labs  11/17/15 0418  11/18/15 0410 12/11/15 0037  01/22/16 0241 02/17/16 04/27/16 1437  NA  --   < > 140 142  < > 140 144 141  K  --   < > 3.3* 3.2*  < > 3.6 3.5 4.3  CL  --   < > 109 109  --  104  --   --   CO2  --   < > 28 28  --  30  --  28  GLUCOSE  --   < >  98 101*  --  99  --  100  BUN  --   < > 19 18  < > 19 17 24.4  CREATININE  --   < > 0.43* 0.30*  < > 0.49 0.4* 0.7  CALCIUM  --   < > 8.3* 8.8*  --  9.1  --  9.3  MG 1.9  --   --   --   --   --   --   --   < > = values in this interval not displayed. Liver Function Tests:  Recent Labs  11/16/15 1659 12/11/15 0037 01/07/16 04/27/16 1437  AST 44* 16 13 14   ALT 26 11* 7 10  ALKPHOS 52 88 54 80  BILITOT 1.2 0.8  --  0.56  PROT 6.5 5.7*  --  6.6  ALBUMIN 3.8 3.4*  --  3.3*   CBC:  Recent Labs  01/21/16 1611 01/22/16 0241 02/17/16 03/10/16 04/27/16 1437  WBC 9.9 7.5 4.9 5.2 6.8  NEUTROABS  --   --  3 3 4.8  HGB 13.1 11.0* 10.9* 12.1 13.7  HCT 41.8 34.1* 34* 38 42.5  MCV 101.5* 99.1  --   --  95.4  PLT 191 179 163 166 177    Cardiac Enzymes:  Recent Labs  11/16/15 1659  CKTOTAL 551*     ASSESSMENT/PLAN:   Physical deconditioning - Continue rehabilitation, PT for therapeutic strengthening exercises; fall precaution  Left breast invasive ductal carcinoma S/P mastectomy - wound is healed; follow-up with surgery; continue tramadol 50 mg 1-2 tabs by mouth every 6 hours when necessary  for pain;  Anastrozole 1 mg PO daily  Low back pain - continue Icy Hot pain relief patch (5% menthol) apply Q AM and remove @ HS and Tramadol 50 mg 1-2 tabs PO Q 6 hours PRN  Osteoporosis - continue Miacalcin 200 units 1 spray into left knee area every other day and 1 spray into a right nare every other day and vitamin D3 50,000  units by mouth every Fridays  Protein calorie malnutrition - continue med Pass 120 mL by mouth twice a day  Constipation - continue senna S 8.6-50 mg 1 tab by mouth twice a day and Miralax 17 gm PO Q D  Vitamin D deficiency - continue vitamin D3 50,000 units by mouth every Fridays       Goals of care:  Short-term rehabilitation    Durenda Age, NP [facility] [phone]

## 2016-05-07 DIAGNOSIS — D649 Anemia, unspecified: Secondary | ICD-10-CM | POA: Diagnosis not present

## 2016-05-07 LAB — BASIC METABOLIC PANEL
BUN: 16 mg/dL (ref 4–21)
CREATININE: 0.5 mg/dL (ref 0.5–1.1)
Glucose: 91 mg/dL
POTASSIUM: 4.3 mmol/L (ref 3.4–5.3)
SODIUM: 140 mmol/L (ref 137–147)

## 2016-05-12 ENCOUNTER — Other Ambulatory Visit: Payer: Self-pay | Admitting: *Deleted

## 2016-05-12 MED ORDER — TRAMADOL HCL 50 MG PO TABS: ORAL_TABLET | ORAL | 0 refills | Status: DC

## 2016-05-12 NOTE — Telephone Encounter (Signed)
[facility] #[phone] Fax: [phone] 

## 2016-05-18 DIAGNOSIS — R2681 Unsteadiness on feet: Secondary | ICD-10-CM | POA: Diagnosis not present

## 2016-05-18 DIAGNOSIS — M6281 Muscle weakness (generalized): Secondary | ICD-10-CM | POA: Diagnosis not present

## 2016-05-18 DIAGNOSIS — M545 Low back pain: Secondary | ICD-10-CM | POA: Diagnosis not present

## 2016-05-27 DIAGNOSIS — M6281 Muscle weakness (generalized): Secondary | ICD-10-CM | POA: Diagnosis not present

## 2016-05-28 DIAGNOSIS — R2689 Other abnormalities of gait and mobility: Secondary | ICD-10-CM | POA: Diagnosis not present

## 2016-05-28 DIAGNOSIS — M6281 Muscle weakness (generalized): Secondary | ICD-10-CM | POA: Diagnosis not present

## 2016-05-31 DIAGNOSIS — R2689 Other abnormalities of gait and mobility: Secondary | ICD-10-CM | POA: Diagnosis not present

## 2016-05-31 DIAGNOSIS — M6281 Muscle weakness (generalized): Secondary | ICD-10-CM | POA: Diagnosis not present

## 2016-06-01 DIAGNOSIS — M6281 Muscle weakness (generalized): Secondary | ICD-10-CM | POA: Diagnosis not present

## 2016-06-01 DIAGNOSIS — R2689 Other abnormalities of gait and mobility: Secondary | ICD-10-CM | POA: Diagnosis not present

## 2016-06-02 DIAGNOSIS — R2689 Other abnormalities of gait and mobility: Secondary | ICD-10-CM | POA: Diagnosis not present

## 2016-06-02 DIAGNOSIS — M6281 Muscle weakness (generalized): Secondary | ICD-10-CM | POA: Diagnosis not present

## 2016-06-03 DIAGNOSIS — R2689 Other abnormalities of gait and mobility: Secondary | ICD-10-CM | POA: Diagnosis not present

## 2016-06-03 DIAGNOSIS — M6281 Muscle weakness (generalized): Secondary | ICD-10-CM | POA: Diagnosis not present

## 2016-06-03 DIAGNOSIS — M545 Low back pain: Secondary | ICD-10-CM | POA: Diagnosis not present

## 2016-06-03 DIAGNOSIS — R2681 Unsteadiness on feet: Secondary | ICD-10-CM | POA: Diagnosis not present

## 2016-06-04 DIAGNOSIS — R2689 Other abnormalities of gait and mobility: Secondary | ICD-10-CM | POA: Diagnosis not present

## 2016-06-04 DIAGNOSIS — M6281 Muscle weakness (generalized): Secondary | ICD-10-CM | POA: Diagnosis not present

## 2016-06-07 DIAGNOSIS — M6281 Muscle weakness (generalized): Secondary | ICD-10-CM | POA: Diagnosis not present

## 2016-06-07 DIAGNOSIS — R2689 Other abnormalities of gait and mobility: Secondary | ICD-10-CM | POA: Diagnosis not present

## 2016-06-08 DIAGNOSIS — M6281 Muscle weakness (generalized): Secondary | ICD-10-CM | POA: Diagnosis not present

## 2016-06-08 DIAGNOSIS — R2689 Other abnormalities of gait and mobility: Secondary | ICD-10-CM | POA: Diagnosis not present

## 2016-06-09 DIAGNOSIS — R2689 Other abnormalities of gait and mobility: Secondary | ICD-10-CM | POA: Diagnosis not present

## 2016-06-09 DIAGNOSIS — M6281 Muscle weakness (generalized): Secondary | ICD-10-CM | POA: Diagnosis not present

## 2016-06-10 DIAGNOSIS — M6281 Muscle weakness (generalized): Secondary | ICD-10-CM | POA: Diagnosis not present

## 2016-06-10 DIAGNOSIS — R2689 Other abnormalities of gait and mobility: Secondary | ICD-10-CM | POA: Diagnosis not present

## 2016-06-11 DIAGNOSIS — R2689 Other abnormalities of gait and mobility: Secondary | ICD-10-CM | POA: Diagnosis not present

## 2016-06-11 DIAGNOSIS — M6281 Muscle weakness (generalized): Secondary | ICD-10-CM | POA: Diagnosis not present

## 2016-06-14 DIAGNOSIS — R2689 Other abnormalities of gait and mobility: Secondary | ICD-10-CM | POA: Diagnosis not present

## 2016-06-14 DIAGNOSIS — M6281 Muscle weakness (generalized): Secondary | ICD-10-CM | POA: Diagnosis not present

## 2016-06-15 DIAGNOSIS — R2689 Other abnormalities of gait and mobility: Secondary | ICD-10-CM | POA: Diagnosis not present

## 2016-06-15 DIAGNOSIS — M6281 Muscle weakness (generalized): Secondary | ICD-10-CM | POA: Diagnosis not present

## 2016-06-23 DIAGNOSIS — R2689 Other abnormalities of gait and mobility: Secondary | ICD-10-CM | POA: Diagnosis not present

## 2016-06-23 DIAGNOSIS — M6281 Muscle weakness (generalized): Secondary | ICD-10-CM | POA: Diagnosis not present

## 2016-06-29 ENCOUNTER — Encounter: Payer: Self-pay | Admitting: Internal Medicine

## 2016-06-29 ENCOUNTER — Non-Acute Institutional Stay (SKILLED_NURSING_FACILITY): Payer: Medicare Other | Admitting: Internal Medicine

## 2016-06-29 DIAGNOSIS — K5909 Other constipation: Secondary | ICD-10-CM | POA: Diagnosis not present

## 2016-06-29 DIAGNOSIS — E43 Unspecified severe protein-calorie malnutrition: Secondary | ICD-10-CM

## 2016-06-29 DIAGNOSIS — M545 Low back pain, unspecified: Secondary | ICD-10-CM

## 2016-06-29 DIAGNOSIS — C50112 Malignant neoplasm of central portion of left female breast: Secondary | ICD-10-CM

## 2016-06-29 DIAGNOSIS — G8929 Other chronic pain: Secondary | ICD-10-CM

## 2016-06-29 DIAGNOSIS — Z170 Estrogen receptor positive status [ER+]: Secondary | ICD-10-CM

## 2016-06-29 DIAGNOSIS — R2681 Unsteadiness on feet: Secondary | ICD-10-CM

## 2016-06-29 DIAGNOSIS — M810 Age-related osteoporosis without current pathological fracture: Secondary | ICD-10-CM

## 2016-06-29 NOTE — Progress Notes (Signed)
LOCATION: [place]  PCP: [name], MD   Code Status: DNR  Goals of care: Advanced Directive information Advanced Directives 05/06/2016  Does Patient Have a Medical Advance Directive? -  Type of Paramedic of [place];Out of facility DNR (pink MOST or yellow form)  Does patient want to make changes to medical advance directive? No - Patient declined  Copy of [place] in Chart? Yes  Would patient like information on creating a medical advance directive? -  Pre-existing out of facility DNR order (yellow form or pink MOST form) -       Extended Emergency Contact Information Primary Emergency Contact: Pegg,Jeffery Address: [address]           [place], [place] [address] [place] Phone: [phone] Relation: Son   Allergies  Allergen Reactions  . No Known Allergies     Chief Complaint  Patient presents with  . Medical Management of Chronic Issues    Routine Visit      HPI:  Patient is a 83 y.o. female seen today for routine visit. She has been at her baseline she denies any concerning this visit. No new concern from nursing. She feeds herself. She is out of bed daily on her wheelchair. She is working with restorative therapy team and uses walker with them. No fall reported. No pressure ulcer reported. No acute behavioral concerns. She is compliant with her medications.  Review of Systems:  Constitutional: Negative for fever, chills HENT: Negative for headache, congestion, nasal discharge. She has hearing loss   Eyes: Negative for blurred vision.  Respiratory: Negative for cough, shortness of breath.   Cardiovascular: Negative for chest pain, palpitations, leg swelling.  Gastrointestinal: Negative for heartburn, nausea, vomiting, abdominal pain. She had a bowel movement this morning Genitourinary: Negative for dysuria and flank pain.  Musculoskeletal: Negative for fall in the facility.   she has chronic back pain Skin: Negative for itching, rash.  Neurological: Negative for dizziness Psychiatric/Behavioral: Negative for depression   Past Medical History:  Diagnosis Date  . Arthritis   . 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 Complication of anesthesia   . Fall   . History of hiatal hernia   . PONV (postoperative nausea and vomiting)   . Protein calorie malnutrition ([place])    Past Surgical History:  Procedure Laterality Date  . ABDOMINAL HYSTERECTOMY    . APPENDECTOMY    . CHOLECYSTECTOMY    . EYE SURGERY     cataracts bilateral eyes  . FRACTURE SURGERY Right    wrist surgery, cadaver implant  . MASTECTOMY COMPLETE / SIMPLE Left 01/21/2016  . TONSILLECTOMY    . TOTAL MASTECTOMY Left 01/21/2016   Procedure: LEFT TOTAL MASTECTOMY;  Surgeon: Excell Seltzer, MD;  Location: [place];  Service: General;  Laterality: Left;   Social History:   reports that she has never smoked. She has never used smokeless tobacco. She reports that she does not drink alcohol or use drugs.  Family History  Problem Relation Age of Onset  . Arthritis/Rheumatoid Mother     Medications: Allergies as of 06/29/2016      Reactions   No Known Allergies       Medication List       Accurate as of 06/29/16  2:15 PM. Always use  your most recent med list.          anastrozole 1 MG tablet Commonly known as:  ARIMIDEX Take 1 mg by mouth daily.   calcitonin (salmon) 200 UNIT/ACT nasal spray Commonly known as:  MIACALCIN/FORTICAL Place 1 spray into alternate nostrils daily.   ICY HOT 5 % Ptch Generic drug:  Menthol Apply 1 each topically. Apply to lower back QAM and remove QHS.  Monitor skin for rash/irritation   ondansetron 4 MG tablet Commonly known as:  ZOFRAN Take 4-8 mg by mouth every 8 (eight) hours as needed for nausea or  vomiting.   polyethylene glycol packet Commonly known as:  MIRALAX / GLYCOLAX Take 17 g by mouth daily.   senna-docusate 8.6-50 MG tablet Commonly known as:  Senokot-S Take 1 tablet by mouth 2 (two) times daily.   traMADol 50 MG tablet Commonly known as:  ULTRAM Take 100 mg by mouth daily. Take at 9PM   traMADol 50 MG tablet Commonly known as:  ULTRAM Take 50-100 mg by mouth every 8 (eight) hours as needed for moderate pain or severe pain.   UNABLE TO FIND Med Name: Med pass 120 mL BID between meals   vitamin B-12 100 MCG tablet Commonly known as:  CYANOCOBALAMIN Take 100 mcg by mouth daily.   vitamin C 500 MG tablet Commonly known as:  ASCORBIC ACID Take 500 mg by mouth daily.   Vitamin D (Ergocalciferol) 50000 units Caps capsule Commonly known as:  DRISDOL Take 50,000 Units by mouth every 7 (seven) days.       Immunizations:  There is no immunization history on file for this patient.   Physical Exam: Vitals:   06/29/16 1410  BP: (!) 170/84  Pulse: 78  Resp: 20  Temp: (!) 96.6 F (35.9 C)  TempSrc: Oral  SpO2: 96%  Weight: 92 lb 9.6 oz (42 kg)  Height: 5\' 5"  (1.651 m)   Body mass index is 15.41 kg/m.  General- elderly female, frail and thin built, in no acute distress Head- normocephalic, atraumatic Nose- no nasal discharge Throat- moist mucus membrane Eyes- PERRLA, EOMI, no pallor, no icterus, no discharge Neck- no cervical lymphadenopathy Cardiovascular- normal s1,s2, no murmur, no leg edema Respiratory- bilateral clear to auscultation, no wheeze, no rhonchi, no crackles, no use of accessory muscles Abdomen- bowel sounds present, soft, non tender Musculoskeletal- able to move all 4 extremities, generalized weakness, arthritis changes to her fingers, Kyphosis present, scoliosis present Neurological- alert and oriented to person, place and time Skin- warm and dry Psychiatry- normal mood and affect    Labs reviewed: Basic Metabolic  Panel:  Recent Labs  11/17/15 0418  11/18/15 0410 12/11/15 0037  01/22/16 0241 02/17/16 04/27/16 1437 05/07/16  NA  --   < > 140 142  < > 140 144 141 140  K  --   < > 3.3* 3.2*  < > 3.6 3.5 4.3 4.3  CL  --   < > 109 109  --  104  --   --   --   CO2  --   < > 28 28  --  30  --  28  --   GLUCOSE  --   < > 98 101*  --  99  --  100  --   BUN  --   < > 19 18  < > 19 17 24.4 16  CREATININE  --   < > 0.43* 0.30*  < > 0.49 0.4* 0.7 0.5  CALCIUM  --   < > 8.3* 8.8*  --  9.1  --  9.3  --   MG 1.9  --   --   --   --   --   --   --   --   < > = values in this interval not displayed. Liver Function Tests:  Recent Labs  11/16/15 1659 12/11/15 0037 01/07/16 04/27/16 1437  AST 44* 16 13 14   ALT 26 11* 7 10  ALKPHOS 52 88 54 80  BILITOT 1.2 0.8  --  0.56  PROT 6.5 5.7*  --  6.6  ALBUMIN 3.8 3.4*  --  3.3*   No results for input(s): LIPASE, AMYLASE in the last 8760 hours. No results for input(s): AMMONIA in the last 8760 hours. CBC:  Recent Labs  01/21/16 1611 01/22/16 0241 02/17/16 03/10/16 04/27/16 1437  WBC 9.9 7.5 4.9 5.2 6.8  NEUTROABS  --   --  3 3 4.8  HGB 13.1 11.0* 10.9* 12.1 13.7  HCT 41.8 34.1* 34* 38 42.5  MCV 101.5* 99.1  --   --  95.4  PLT 191 179 163 166 177   Cardiac Enzymes:  Recent Labs  11/16/15 1659  CKTOTAL 551*     Assessment/Plan  Unsteady gait Patient is currently working with restorative therapy team to help with her gait training and safety transfers. Fall precautions   Chronic lumbar back pain Has history of compression fracture at L4 level. Continue icy hot patch to her lower back. Continue tramadol 100 mg at bedtime. She is also on tramadol 50 mg 1-2 tab every 8 hours as needed for pain. Followed by PMR.  Severe protein calorie malnutrition RD on board. Continue med Pass supplement. Low but stable weight on chart review. Wt Readings from Last 3 Encounters:  06/29/16 92 lb 9.6 oz (42 kg)  05/06/16 90 lb 9.6 oz (41.1 kg)  04/27/16 89 lb  14.4 oz (40.8 kg)    Left breast cancer Status post left breast mastectomy. Continue anastrozole daily. To follow up with oncology  Osteoporosis Continue calcitonin and vitamin D supplement.  Chronic constipation Stable. Continue senna as an MiraLAX current regimen. Hydration to be maintained    Family/ staff Communication: reviewed care plan with patient and nursing supervisor    Blanchie Serve, MD Internal Medicine [place], [place] [address] Cell Phone (Monday-Friday 8 am - 5 pm): [phone] On Call: [phone] and follow prompts after 5 pm and on weekends Office Phone: [phone] Office Fax: [phone]

## 2016-07-06 DIAGNOSIS — M79674 Pain in right toe(s): Secondary | ICD-10-CM | POA: Diagnosis not present

## 2016-07-06 DIAGNOSIS — M79675 Pain in left toe(s): Secondary | ICD-10-CM | POA: Diagnosis not present

## 2016-07-06 DIAGNOSIS — B351 Tinea unguium: Secondary | ICD-10-CM | POA: Diagnosis not present

## 2016-07-06 DIAGNOSIS — I739 Peripheral vascular disease, unspecified: Secondary | ICD-10-CM | POA: Diagnosis not present

## 2016-07-20 DIAGNOSIS — M6281 Muscle weakness (generalized): Secondary | ICD-10-CM | POA: Diagnosis not present

## 2016-07-21 DIAGNOSIS — M6281 Muscle weakness (generalized): Secondary | ICD-10-CM | POA: Diagnosis not present

## 2016-07-22 DIAGNOSIS — M6281 Muscle weakness (generalized): Secondary | ICD-10-CM | POA: Diagnosis not present

## 2016-07-23 DIAGNOSIS — M6281 Muscle weakness (generalized): Secondary | ICD-10-CM | POA: Diagnosis not present

## 2016-07-26 DIAGNOSIS — M6281 Muscle weakness (generalized): Secondary | ICD-10-CM | POA: Diagnosis not present

## 2016-07-27 ENCOUNTER — Telehealth: Payer: Self-pay | Admitting: Adult Health

## 2016-07-27 NOTE — Telephone Encounter (Signed)
The Survivorship Care Plan was mailed to Ms. Rothert as she reported not being able to come in to the Survivorship Clinic for an in-person visit at this time. A letter was mailed to her outlining the purpose of the content of the care plan, as well as encouraging her to reach out to me with any questions or concerns.  Additional resources regarding healthy eating, recommendations for exercise, and brochures for support services were also included in the mailed packet.  A shorter version of the care plan was also routed/faxed/mailed to [name], MD, the patient's PCP.  I will not be placing any follow-up appointments to the Survivorship Clinic for Brooke Perkins, but I am happy to see her at any time in the future for any survivorship concerns that may arise. Thank you for allowing me to participate in her care!  Charlestine Massed, NP [place] [phone]

## 2016-08-02 DIAGNOSIS — M545 Low back pain: Secondary | ICD-10-CM | POA: Diagnosis not present

## 2016-08-02 DIAGNOSIS — M6281 Muscle weakness (generalized): Secondary | ICD-10-CM | POA: Diagnosis not present

## 2016-08-02 DIAGNOSIS — R2681 Unsteadiness on feet: Secondary | ICD-10-CM | POA: Diagnosis not present

## 2016-08-13 ENCOUNTER — Encounter: Payer: Self-pay | Admitting: Adult Health

## 2016-08-13 ENCOUNTER — Non-Acute Institutional Stay (SKILLED_NURSING_FACILITY): Payer: Medicare Other | Admitting: Adult Health

## 2016-08-13 DIAGNOSIS — E43 Unspecified severe protein-calorie malnutrition: Secondary | ICD-10-CM

## 2016-08-13 DIAGNOSIS — C50112 Malignant neoplasm of central portion of left female breast: Secondary | ICD-10-CM | POA: Diagnosis not present

## 2016-08-13 DIAGNOSIS — M545 Low back pain, unspecified: Secondary | ICD-10-CM

## 2016-08-13 DIAGNOSIS — E559 Vitamin D deficiency, unspecified: Secondary | ICD-10-CM | POA: Diagnosis not present

## 2016-08-13 DIAGNOSIS — K5901 Slow transit constipation: Secondary | ICD-10-CM | POA: Diagnosis not present

## 2016-08-13 DIAGNOSIS — Z170 Estrogen receptor positive status [ER+]: Secondary | ICD-10-CM | POA: Diagnosis not present

## 2016-08-13 NOTE — Progress Notes (Signed)
Patient ID: Brooke Perkins, female   DOB: [birth year], 83 y.o.   MRN: [id number]    DATE:  08/13/16  MRN:  [id number]  BIRTHDAY: 10-Sep-1916  Facility:  Nursing Home Location:  [place] [facility] [place] Room Number: 908-P  LEVEL OF CARE:  SNF (31)  Contact Information    Name Relation Home Work Mobile   [name] Son [phone]         Code Status History    Date Active Date Inactive Code Status Order ID Comments User Context   01/21/2016  2:58 PM 01/22/2016  2:56 PM Partial Code [id number]  Excell Seltzer, MD Inpatient   11/16/2015  9:55 PM 11/18/2015  9:05 PM Full Code [id number]  Ivor Costa, MD ED    Questions for Most Recent Historical Code Status (Order [id number])    Question Answer Comment   In the event of cardiac or respiratory ARREST: Initiate Code Blue, Call Rapid Response No    In the event of cardiac or respiratory ARREST: Perform CPR No    In the event of cardiac or respiratory ARREST: Perform Intubation/Mechanical Ventilation No    In the event of cardiac or respiratory ARREST: Use NIPPV/BiPAp only if indicated No    In the event of cardiac or respiratory ARREST: Administer ACLS medications if indicated Yes    In the event of cardiac or respiratory ARREST: Perform Defibrillation or Cardioversion if indicated Yes         Advance Directive Documentation   Flowsheet Row Most Recent Value  Type of Advance Directive  Out of facility DNR (pink MOST or yellow form)  Pre-existing out of facility DNR order (yellow form or pink MOST form)  No data  "MOST" Form in Place?  No data       Chief Complaint  Patient presents with  . Medical Management of Chronic Issues    HISTORY OF PRESENT ILLNESS:  This is a 83 year old female who is being seen for a routine visit. She is now a long-term resident @ [place]. She had a recent fall and refused to go to hospital for evaluation. She was seen in the room and did not verbalize any  concerns.   PAST MEDICAL HISTORY:  Past Medical History:  Diagnosis Date  . Arthritis   . Breast cancer, left breast ([place])     invasive ductal carcinoma with erosion through the skin and a central ulceration lateral to the nipple measuring about 4 cm in diameter/notes 01/21/2016  . Complication of anesthesia   . Fall   . History of hiatal hernia   . PONV (postoperative nausea and vomiting)   . Protein calorie malnutrition ([place])      CURRENT MEDICATIONS: Reviewed  Patient's Medications  New Prescriptions   No medications on file  Previous Medications   ANASTROZOLE (ARIMIDEX) 1 MG TABLET    Take 1 mg by mouth daily.   CALCITONIN, SALMON, (MIACALCIN/FORTICAL) 200 UNIT/ACT NASAL SPRAY    Place 1 spray into alternate nostrils daily.    MENTHOL (ICY HOT) 5 % PTCH    Apply 1 each topically. Apply to lower back QAM and remove QHS.  Monitor skin for rash/irritation   ONDANSETRON (ZOFRAN) 4 MG TABLET    Take 4-8 mg by mouth every 8 (eight) hours as needed for nausea or vomiting.   POLYETHYLENE GLYCOL (MIRALAX / GLYCOLAX) PACKET    Take 17 g by mouth daily.   SENNA-DOCUSATE (SENOKOT-S) 8.6-50 MG TABLET    Take  1 tablet by mouth 2 (two) times daily.   TRAMADOL (ULTRAM) 50 MG TABLET    Take 100 mg by mouth 2 (two) times daily. Take at 9AM and 9PM - hold for sedation   TRAMADOL (ULTRAM) 50 MG TABLET    Take 50 mg by mouth every 12 (twelve) hours as needed for moderate pain or severe pain.    UNABLE TO FIND    Med Name: Med pass 120 mL BID between meals   VITAMIN B-12 (CYANOCOBALAMIN) 100 MCG TABLET    Take 100 mcg by mouth daily.   VITAMIN C (ASCORBIC ACID) 500 MG TABLET    Take 500 mg by mouth daily.   VITAMIN D, ERGOCALCIFEROL, (DRISDOL) 50000 UNITS CAPS CAPSULE    Take 50,000 Units by mouth every 7 (seven) days.  Modified Medications   No medications on file  Discontinued Medications   No medications on file     Allergies  Allergen Reactions  . No Known Allergies      REVIEW OF  SYSTEMS:  GENERAL: no change in appetite, no fatigue, no weight changes, no fever, chills or weakness EYES: Denies change in vision, dry eyes, eye pain, itching or discharge EARS: Denies change in hearing, ringing in ears, or earache NOSE: Denies nasal congestion or epistaxis MOUTH and THROAT: Denies oral discomfort, gingival pain or bleeding, pain from teeth or hoarseness   RESPIRATORY: no cough, SOB, DOE, wheezing, hemoptysis CARDIAC: no chest pain, edema or palpitations GI: no abdominal pain, diarrhea, constipation, heart burn, nausea or vomiting GU: Denies dysuria, frequency, hematuria, incontinence, or discharge PSYCHIATRIC: Denies feeling of depression or anxiety. No report of hallucinations, insomnia, paranoia, or agitation   PHYSICAL EXAMINATION  GENERAL APPEARANCE:  In no acute distress.  SKIN:  S/P Left breast mastectomy surgical site is healed HEAD: Normal in size and contour. No evidence of trauma EYES: Lids open and close normally. No blepharitis, entropion or ectropion. PERRL. Conjunctivae are clear and sclerae are white. Lenses are without opacity EARS: Pinnae are normal. Hard of hearing MOUTH and THROAT: Lips are without lesions. Oral mucosa is moist and without lesions. Tongue is normal in shape, size, and color and without lesions NECK: supple, trachea midline, no neck masses, no thyroid tenderness, no thyromegaly LYMPHATICS: no LAN in the neck, no supraclavicular LAN RESPIRATORY: breathing is even & unlabored, BS CTAB CARDIAC: RRR, no murmur,no extra heart sounds, no edema GI: abdomen soft, normal BS, no masses, no tenderness, no hepatomegaly, no splenomegaly EXTREMITIES:  Able to move X 4 extremities PSYCHIATRIC: Alert and oriented X 3. Affect and behavior are appropriate   LABS/RADIOLOGY: Labs reviewed: Basic Metabolic Panel:  Recent Labs  11/17/15 0418  11/18/15 0410 12/11/15 0037  01/22/16 0241 02/17/16 04/27/16 1437 05/07/16  NA  --   < > 140 142  < >  140 144 141 140  K  --   < > 3.3* 3.2*  < > 3.6 3.5 4.3 4.3  CL  --   < > 109 109  --  104  --   --   --   CO2  --   < > 28 28  --  30  --  28  --   GLUCOSE  --   < > 98 101*  --  99  --  100  --   BUN  --   < > 19 18  < > 19 17 24.4 16  CREATININE  --   < > 0.43* 0.30*  < >  0.49 0.4* 0.7 0.5  CALCIUM  --   < > 8.3* 8.8*  --  9.1  --  9.3  --   MG 1.9  --   --   --   --   --   --   --   --   < > = values in this interval not displayed. Liver Function Tests:  Recent Labs  11/16/15 1659 12/11/15 0037 01/07/16 04/27/16 1437  AST 44* 16 13 14   ALT 26 11* 7 10  ALKPHOS 52 88 54 80  BILITOT 1.2 0.8  --  0.56  PROT 6.5 5.7*  --  6.6  ALBUMIN 3.8 3.4*  --  3.3*   CBC:  Recent Labs  01/21/16 1611 01/22/16 0241 02/17/16 03/10/16 04/27/16 1437  WBC 9.9 7.5 4.9 5.2 6.8  NEUTROABS  --   --  3 3 4.8  HGB 13.1 11.0* 10.9* 12.1 13.7  HCT 41.8 34.1* 34* 38 42.5  MCV 101.5* 99.1  --   --  95.4  PLT 191 179 163 166 177    Cardiac Enzymes:  Recent Labs  11/16/15 1659  CKTOTAL 551*     ASSESSMENT/PLAN:  Low back pain - continue Icy Hot pain relief patch (5% menthol) apply Q AM and remove @ HS and Tramadol 50 mg 2 tabs PO Q AM and HS and  50 mg Q 12 hours PRN; followed-up by physiatry  Left breast invasive ductal carcinoma S/P mastectomy - wound is healed;  continue tramadol 50 mg 2 tabs = 100 mg by mouth Q AM and HS and 50 mg Q 12 hours PRN  for pain;  Anastrozole 1 mg PO daily  Protein calorie malnutrition - weight 87.8 lbs, BMI 15.08; continue med Pass 120 mL by mouth twice a day; check CBC and CMP  Constipation - continue senna S 8.6-50 mg 1 tab by mouth twice a day and Miralax 17 gm PO Q D  Vitamin D deficiency - continue vitamin D3 50,000 units by mouth every Fridays     Goals of care:  Long-term care    Durenda Age, NP [facility] [phone]

## 2016-08-16 ENCOUNTER — Other Ambulatory Visit: Payer: Self-pay | Admitting: *Deleted

## 2016-08-16 DIAGNOSIS — D649 Anemia, unspecified: Secondary | ICD-10-CM | POA: Diagnosis not present

## 2016-08-16 DIAGNOSIS — Z79899 Other long term (current) drug therapy: Secondary | ICD-10-CM | POA: Diagnosis not present

## 2016-08-16 LAB — HEPATIC FUNCTION PANEL
ALK PHOS: 71 U/L (ref 25–125)
ALT: 9 U/L (ref 7–35)
ALT: 9 U/L (ref 7–35)
AST: 16 U/L (ref 13–35)
AST: 16 U/L (ref 13–35)
Alkaline Phosphatase: 71 U/L (ref 25–125)
Bilirubin, Total: 0.5 mg/dL
Bilirubin, Total: 0.5 mg/dL

## 2016-08-16 LAB — CBC AND DIFFERENTIAL
HCT: 40 % (ref 36–46)
HEMATOCRIT: 40 % (ref 36–46)
HEMOGLOBIN: 12.9 g/dL (ref 12.0–16.0)
Hemoglobin: 12.9 g/dL (ref 12.0–16.0)
NEUTROS ABS: 6 /uL
Neutrophils Absolute: 6 /uL
PLATELETS: 218 10*3/uL (ref 150–399)
Platelets: 218 10*3/uL (ref 150–399)
WBC: 7.7 10*3/mL
WBC: 7.7 10^3/mL

## 2016-08-16 LAB — BASIC METABOLIC PANEL
BUN: 20 mg/dL (ref 4–21)
BUN: 20 mg/dL (ref 4–21)
Creatinine: 0.5 mg/dL (ref 0.5–1.1)
Creatinine: 0.5 mg/dL (ref 0.5–1.1)
Glucose: 80 mg/dL
Glucose: 80 mg/dL
Potassium: 4 mmol/L (ref 3.4–5.3)
Potassium: 4 mmol/L (ref 3.4–5.3)
Sodium: 142 mmol/L (ref 137–147)
Sodium: 142 mmol/L (ref 137–147)

## 2016-08-16 MED ORDER — TRAMADOL HCL 50 MG PO TABS: ORAL_TABLET | ORAL | 0 refills | Status: DC

## 2016-08-16 NOTE — Telephone Encounter (Signed)
[facility] #[phone] Fax: [phone] 

## 2016-08-23 NOTE — Progress Notes (Signed)
[place]  [phone] [phone]  Clinic Follow up Note   Patient Care Team: Deland Pretty, MD as PCP - General (Internal Medicine) Truitt Merle, MD as Consulting Physician (Hematology) Excell Seltzer, MD as Consulting Physician (General Surgery) Minette Headland, NP as Nurse Practitioner (Hematology and Oncology) 08/24/2016  SUMMARY OF ONCOLOGIC HISTORY: Oncology History   Cancer of central portion of left female breast [facility])   Staging form: Breast, AJCC 7th Edition   - Clinical stage from 12/05/2015: Stage IIB (T3, N0, M0) - Signed by Truitt Merle, MD on 12/22/2015   - Pathologic stage from 01/21/2016: Stage IIIB (T4b, N1a, cM0) - Signed by Truitt Merle, MD on 02/18/2016       Cancer of central portion of left female breast ([place])   12/05/2015 Initial Diagnosis    Cancer of central portion of left female breast ([place])      12/05/2015 Mammogram    Your irregular fungating mass within the retroviral left breast, extending to the upper inner quadrant of the left breast, at least a 5 cm in greatest dimension, with obvious skin involvement, left axillary nodes were negative by ultrasound.      12/05/2015 Initial Biopsy    Left member breast mass biopsy showed invasive lobular carcinoma      12/05/2015 Receptors her2    ER 95% positive, PR 95% positive, HER-2 negative, Ki-67 12%,      01/21/2016 Surgery    Left mastectomy and SLN biopsy       01/21/2016 Pathology Results    Left mastectomy showed G2 invasive lobular carcinoma with skin involvement, at least two foci, the larger spans 5cm, (+) LVI, posterior margin broadly <0.1cm, 1 out of 2 nodes positive for cancer       03/12/2016 -  Anti-estrogen oral therapy    Anastrozole 1 mg daily      History of present illness (11/17/2015):  Brooke Perkins is a 83 year old Caucasian female, with past medical history of hearing loss, arthritis, presented to the emergency room after a fall at home. She lives alone.  She also has semi-dysuria, no fever at home. In the ER, urine test was positive for UTI. She was also found to have a left breast mass, which she reports it has been there for the past 2-3 months. I was called to evaluate her left breast mass. She denies significant pain in the left breast, no discharge.   She has a son who lives in [place]. She has not told anybody about that her left breast mass, she is not ready to discuss this with her son yet. She states " I do not want to fight, I'm 83 year old." She lives independently, able to take care of herself and function well at home.   CURRENT THERAPY:  anastrozole 53m daily, started on 03/12/2016  INTERVAL HISTORY: FCaffiereturns for follow-up. She presents in a wheelchair. She says she has been doing okay. She has some back pain, which she takes some pain medication for. She has no appetite. Denies breast pain, abdominal pain, or any other concerns. She lives in a nursing home. She stays active and reads a lot. She has had no problems with anastrozole and takes it every morning.   REVIEW OF SYSTEMS:   Constitutional: Denies fevers, chills (+) loss of appetite Eyes: Denies blurriness of vision Ears, nose, mouth, throat, and face: Denies mucositis or sore throat Respiratory: Denies cough, dyspnea or wheezes Cardiovascular: Denies palpitation, chest discomfort or lower extremity  swelling Gastrointestinal:  Denies nausea, heartburn or change in bowel habits Skin: Denies abnormal skin rashes Lymphatics: Denies new lymphadenopathy or easy bruising Neurological:Denies numbness, tingling or new weaknesses Behavioral/Psych: Mood is stable, no new changes  Musculoskeletal: (+) back pain All other systems were reviewed with the patient and are negative.  MEDICAL HISTORY:  Past Medical History:  Diagnosis Date  . Arthritis   . Breast cancer, left breast ([place])     invasive ductal carcinoma with erosion through the skin and a central ulceration  lateral to the nipple measuring about 4 cm in diameter/notes 01/21/2016  . Complication of anesthesia   . Fall   . History of hiatal hernia   . PONV (postoperative nausea and vomiting)   . Protein calorie malnutrition ([place])     SURGICAL HISTORY: Past Surgical History:  Procedure Laterality Date  . ABDOMINAL HYSTERECTOMY    . APPENDECTOMY    . CHOLECYSTECTOMY    . EYE SURGERY     cataracts bilateral eyes  . FRACTURE SURGERY Right    wrist surgery, cadaver implant  . MASTECTOMY COMPLETE / SIMPLE Left 01/21/2016  . TONSILLECTOMY    . TOTAL MASTECTOMY Left 01/21/2016   Procedure: LEFT TOTAL MASTECTOMY;  Surgeon: Excell Seltzer, MD;  Location: [place];  Service: General;  Laterality: Left;    I have reviewed the social history and family history with the patient and they are unchanged from previous note.  ALLERGIES:  is allergic to no known allergies.  MEDICATIONS:  Current Outpatient Prescriptions  Medication Sig Dispense Refill  . anastrozole (ARIMIDEX) 1 MG tablet Take 1 mg by mouth daily.    . calcitonin, salmon, (MIACALCIN/FORTICAL) 200 UNIT/ACT nasal spray Place 1 spray into alternate nostrils daily.     . Menthol (ICY HOT) 5 % PTCH Apply 1 each topically. Apply to lower back QAM and remove QHS.  Monitor skin for rash/irritation    . ondansetron (ZOFRAN) 4 MG tablet Take 4-8 mg by mouth every 8 (eight) hours as needed for nausea or vomiting.    . polyethylene glycol (MIRALAX / GLYCOLAX) packet Take 17 g by mouth daily.    Marland Kitchen senna-docusate (SENOKOT-S) 8.6-50 MG tablet Take 1 tablet by mouth 2 (two) times daily.    . traMADol (ULTRAM) 50 MG tablet Take 50 mg by mouth every 12 (twelve) hours as needed for moderate pain or severe pain.     . traMADol (ULTRAM) 50 MG tablet Take two tablets by mouth every evening for pain; Take one tablet by mouth every 8 hours as needed for pain. 150 tablet 0  . UNABLE TO FIND Med Name: Med pass 120 mL BID between meals    . vitamin B-12  (CYANOCOBALAMIN) 100 MCG tablet Take 100 mcg by mouth daily.    . vitamin C (ASCORBIC ACID) 500 MG tablet Take 500 mg by mouth daily.    . Vitamin D, Ergocalciferol, (DRISDOL) 50000 units CAPS capsule Take 50,000 Units by mouth every 7 (seven) days.     No current facility-administered medications for this visit.     PHYSICAL EXAMINATION: ECOG PERFORMANCE STATUS: 3  Vitals:   08/24/16 1434  BP: 135/62  Pulse: (!) 102  Resp: 18  Temp: 98 F (36.7 C)   Filed Weights   08/24/16 1434  Weight: 87 lb 1.6 oz (39.5 kg)   GENERAL:alert, no distress and comfortable. In wheelchair SKIN: skin color, texture, turgor are normal, no rashes or significant lesions EYES: normal, Conjunctiva are pink and non-injected, sclera  clear OROPHARYNX:no exudate, no erythema and lips, buccal mucosa, and tongue normal  NECK: supple, thyroid normal size, non-tender, without nodularity LYMPH:  no palpable lymphadenopathy in the cervical, axillary or inguinal LUNGS: clear to auscultation and percussion with normal breathing effort HEART: regular rate & rhythm and no murmurs and no lower extremity edema ABDOMEN:abdomen soft, non-tender and normal bowel sounds Musculoskeletal:no cyanosis of digits and no clubbing  NEURO: alert & oriented x 3 with fluent speech, no focal motor/sensory deficits Breasts: Left breast mastectomy site is well healed. Left axillary region is clear of abnormality. Right breast and right axilla are without abnormality.   LABORATORY DATA:  I have reviewed the data as listed CBC Latest Ref Rng & Units 08/24/2016 04/27/2016 03/10/2016  WBC 3.9 - 10.3 10e3/uL 7.7 6.8 5.2  Hemoglobin 11.6 - 15.9 g/dL 13.8 13.7 12.1  Hematocrit 34.8 - 46.6 % 42.7 42.5 38  Platelets 145 - 400 10e3/uL 214 177 166     CMP Latest Ref Rng & Units 08/24/2016 05/07/2016 04/27/2016  Glucose 70 - 140 mg/dl 157(H) - 100  BUN 7.0 - 26.0 mg/dL 20.9 16 24.4  Creatinine 0.6 - 1.1 mg/dL 0.7 0.5 0.7  Sodium 136 - 145  mEq/L 141 140 141  Potassium 3.5 - 5.1 mEq/L 4.5 4.3 4.3  Chloride 101 - 111 mmol/L - - -  CO2 22 - 29 mEq/L 29 - 28  Calcium 8.4 - 10.4 mg/dL 9.8 - 9.3  Total Protein 6.4 - 8.3 g/dL 7.1 - 6.6  Total Bilirubin 0.20 - 1.20 mg/dL 0.53 - 0.56  Alkaline Phos 40 - 150 U/L 79 - 80  AST 5 - 34 U/L 15 - 14  ALT 0 - 55 U/L 12 - 10   PATHOLOGY REPORT  Diagnosis 12/05/2015 Breast, left, needle core biopsy, 10:00 o'clock - INVASIVE MAMMARY CARCINOMA. - SEE COMMENT. Microscopic Comment The carcinoma appears grade 1-2. An E-Cadherin stain will be performed and the results reported separately. A breast prognostic profile will be performed and the results reported separately. The results were called to [facility] on 12/08/15. (JBK:ds 12/08/15) By immunohistochemistry, the malignant cells are negative for E-Cadherin, supporting a lobular phenotype. (JBK:ds 12/08/15)  Results: IMMUNOHISTOCHEMICAL AND MORPHOMETRIC ANALYSIS PERFORMED MANUALLY Estrogen Receptor: 95%, POSITIVE, STRONG STAINING INTENSITY Progesterone Receptor: 95%, POSITIVE, STRONG STAINING INTENSITY Proliferation Marker Ki67: 12% FLUORESCENCE IN-SITU HYBRIDIZATION Results: HER2 - NEGATIVE RATIO OF HER2/CEP17 SIGNALS 1.27 AVERAGE HER2 COPY NUMBER PER CELL 2.80  Diagnosis 01/21/2016 Breast, simple mastectomy, Left - INVASIVE LOBULAR CARCINOMA, GRADE II/III, AT LEAST TWO FOCI, THE LARGER SPANS 5.0 CM. - LYMPHOVASCULAR INVASION IS IDENTIFIED. - CARCINOMA INVOLVES THE NIPPLE AND SKIN WITH ULCERATION. - INVASIVE CARCINOMA IS BROADLY LESS THAN 0.1 CM TO THE POSTERIOR MARGIN. - METASTATIC CARCINOMA IN 1 OF 2 LYMPH NODES (1/2). - SEE ONCOLOGY TABLE BELOW. Microscopic Comment BREAST, INVASIVE TUMOR, WITHOUT LYMPH NODES PRESENT Specimen, including laterality : Left breast Procedure: Simple mastectomy Histologic type: Lobular Grade: II Tubule formation: 3 Nuclear pleomorphism: 2 Mitotic: 1 Tumor size (gross  measurement): 5.0 cm Margins: Invasive, distance to closest margin: Broadly less than 0.1 cm to the posterior margin Lymphovascular invasion: Present Ductal carcinoma in situ: Not identified Tumor focality: At least two foci Treatment effect: N/A Extent of tumor: Skin: Involved, with ulceration Nipple: Involved Skeletal muscle: Not identified. Breast prognostic profile: [id number] Estrogen receptor: 95% 1 of 3 FINAL for SERENIDY, WALTZ ([id number]) Microscopic Comment(continued) Progesterone receptor: 95% Her 2 neu: No amplification was detected. Ki-67: 12%  Non-neoplastic breast: Significant vascular calcifications. TNM: pT4b, pN1a Comments: In addition to the main 5.0 cm tumor, there is similar appearing invasive lobular carcinoma present in random tissue submitted from the inner quadrant of the specimen. An immunohistochemical stain for cytokeratin was used to help evaluate this case. (JBK:kh 01-26-16)  RADIOGRAPHIC STUDIES: I have personally reviewed the radiological images as listed and agreed with the findings in the report.  Diagnostic mammogram and ultrasound bilateral breast 12/05/2015 IMPRESSION: Irregular fungating mass within the retroareolar left breast, extending to the upper inner quadrant of the left breast, measuring at least 5 cm greatest dimension based on the mammogram, with obvious skin involvement. Ultrasound-guided biopsy is recommended.   ASSESSMENT & PLAN:  83 y.o. Caucasian female, with past medical history of arthritis, fall, wheelchair bound, presented with a large left breast mass.  1. Central portion of left female breast, invasive lobular carcinoma, pT4N1aM0, stage IIIB, ER+/PR+/HER2- -I previously discussed lab findings with patient and her son Dellis Filbert -Her breast cancer was complete resected, with close margin. 2 lymph nodes were positive. She had stage III disease. -The surgical wound has healed completely. -She takes anastrozole 1 mg  daily without issue, will continue for 5-10 years  -She is clinically doing well, no new pain or pressure symptoms, exam was unremarkable, lab results reviewed with her, no clinical concern for recurrence or metastasis. -We again discussed breast cancer surveillance after her surgery, if she is able to, we would consider annual screening mammogram of her right breast, and follow up with [place] regularly for lab and exam.   2. Arthritis and history of fall -She uses a wheelchair -Tylenol and tramadol as needed for pain control  3. Weight loss and malnutrition -I encouraged her to take nutrition supplement.  Plan -She will continue anastrozole 1 mg daily  -Ordered mammogram for June 2018.  -Follow up in 4 months    All questions were answered. The patient knows to call the clinic with any problems, questions or concerns. No barriers to learning was detected.  I spent 20 minutes counseling the patient face to face. The total time spent in the appointment was 25 minutes and more than 50% was on counseling and review of test results  This document serves as a record of services personally performed by Truitt Merle, MD. It was created on her behalf by Martinique Casey, a trained medical scribe. The creation of this record is based on the scribe's personal observations and the provider's statements to them. This document has been checked and approved by the attending provider.  I have reviewed the above documentation for accuracy and completeness and I agree with the above.   Truitt Merle, MD 08/24/16

## 2016-08-24 ENCOUNTER — Encounter: Payer: Self-pay | Admitting: Hematology

## 2016-08-24 ENCOUNTER — Other Ambulatory Visit: Payer: Self-pay | Admitting: *Deleted

## 2016-08-24 ENCOUNTER — Telehealth: Payer: Self-pay | Admitting: Hematology

## 2016-08-24 ENCOUNTER — Ambulatory Visit (HOSPITAL_BASED_OUTPATIENT_CLINIC_OR_DEPARTMENT_OTHER): Payer: Medicare Other | Admitting: Hematology

## 2016-08-24 ENCOUNTER — Other Ambulatory Visit (HOSPITAL_BASED_OUTPATIENT_CLINIC_OR_DEPARTMENT_OTHER): Payer: Medicare Other

## 2016-08-24 VITALS — BP 135/62 | HR 102 | Temp 98.0°F | Resp 18 | Ht 65.0 in | Wt 87.1 lb

## 2016-08-24 DIAGNOSIS — M1990 Unspecified osteoarthritis, unspecified site: Secondary | ICD-10-CM

## 2016-08-24 DIAGNOSIS — Z9181 History of falling: Secondary | ICD-10-CM

## 2016-08-24 DIAGNOSIS — R634 Abnormal weight loss: Secondary | ICD-10-CM | POA: Diagnosis not present

## 2016-08-24 DIAGNOSIS — E46 Unspecified protein-calorie malnutrition: Secondary | ICD-10-CM

## 2016-08-24 DIAGNOSIS — C50112 Malignant neoplasm of central portion of left female breast: Secondary | ICD-10-CM | POA: Diagnosis not present

## 2016-08-24 DIAGNOSIS — Z170 Estrogen receptor positive status [ER+]: Secondary | ICD-10-CM

## 2016-08-24 LAB — COMPREHENSIVE METABOLIC PANEL
ALK PHOS: 79 U/L (ref 40–150)
ALT: 12 U/L (ref 0–55)
AST: 15 U/L (ref 5–34)
Albumin: 3.5 g/dL (ref 3.5–5.0)
Anion Gap: 8 mEq/L (ref 3–11)
BILIRUBIN TOTAL: 0.53 mg/dL (ref 0.20–1.20)
BUN: 20.9 mg/dL (ref 7.0–26.0)
CO2: 29 mEq/L (ref 22–29)
CREATININE: 0.7 mg/dL (ref 0.6–1.1)
Calcium: 9.8 mg/dL (ref 8.4–10.4)
Chloride: 103 mEq/L (ref 98–109)
EGFR: 68 mL/min/{1.73_m2} — ABNORMAL LOW (ref >90)
GLUCOSE: 157 mg/dL — AB (ref 70–140)
Potassium: 4.5 mEq/L (ref 3.5–5.1)
SODIUM: 141 meq/L (ref 136–145)
TOTAL PROTEIN: 7.1 g/dL (ref 6.4–8.3)

## 2016-08-24 LAB — CBC WITH DIFFERENTIAL/PLATELET
BASO%: 0.5 % (ref 0.0–2.0)
BASOS ABS: 0 10*3/uL (ref 0.0–0.1)
EOS ABS: 0.2 10*3/uL (ref 0.0–0.5)
EOS%: 2.6 % (ref 0.0–7.0)
HCT: 42.7 % (ref 34.8–46.6)
HEMOGLOBIN: 13.8 g/dL (ref 11.6–15.9)
LYMPH#: 1.3 10*3/uL (ref 0.9–3.3)
LYMPH%: 16.8 % (ref 14.0–49.7)
MCH: 31.5 pg (ref 25.1–34.0)
MCHC: 32.3 g/dL (ref 31.5–36.0)
MCV: 97.5 fL (ref 79.5–101.0)
MONO#: 0.4 10*3/uL (ref 0.1–0.9)
MONO%: 5.6 % (ref 0.0–14.0)
NEUT%: 74.5 % (ref 38.4–76.8)
NEUTROS ABS: 5.8 10*3/uL (ref 1.5–6.5)
Platelets: 214 10*3/uL (ref 145–400)
RBC: 4.38 10*6/uL (ref 3.70–5.45)
RDW: 15.9 % — ABNORMAL HIGH (ref 11.2–14.5)
WBC: 7.7 10*3/uL (ref 3.9–10.3)

## 2016-08-24 NOTE — Telephone Encounter (Signed)
Appointments scheduled per 08/24/16 los. Patient was given a copy of the AVS report and appointment schedule per 08/24/16 los. °

## 2016-08-27 DIAGNOSIS — M6281 Muscle weakness (generalized): Secondary | ICD-10-CM | POA: Diagnosis not present

## 2016-08-30 DIAGNOSIS — M6281 Muscle weakness (generalized): Secondary | ICD-10-CM | POA: Diagnosis not present

## 2016-08-30 DIAGNOSIS — M545 Low back pain: Secondary | ICD-10-CM | POA: Diagnosis not present

## 2016-08-30 DIAGNOSIS — R2681 Unsteadiness on feet: Secondary | ICD-10-CM | POA: Diagnosis not present

## 2016-09-02 DIAGNOSIS — M6281 Muscle weakness (generalized): Secondary | ICD-10-CM | POA: Diagnosis not present

## 2016-09-07 ENCOUNTER — Encounter: Payer: Self-pay | Admitting: Adult Health

## 2016-09-07 ENCOUNTER — Other Ambulatory Visit: Payer: Self-pay | Admitting: Hematology

## 2016-09-07 ENCOUNTER — Non-Acute Institutional Stay (SKILLED_NURSING_FACILITY): Payer: Medicare Other | Admitting: Adult Health

## 2016-09-07 DIAGNOSIS — E559 Vitamin D deficiency, unspecified: Secondary | ICD-10-CM

## 2016-09-07 DIAGNOSIS — C50912 Malignant neoplasm of unspecified site of left female breast: Secondary | ICD-10-CM | POA: Diagnosis not present

## 2016-09-07 DIAGNOSIS — M545 Low back pain, unspecified: Secondary | ICD-10-CM

## 2016-09-07 DIAGNOSIS — M810 Age-related osteoporosis without current pathological fracture: Secondary | ICD-10-CM

## 2016-09-07 DIAGNOSIS — Z1231 Encounter for screening mammogram for malignant neoplasm of breast: Secondary | ICD-10-CM

## 2016-09-07 DIAGNOSIS — Z9012 Acquired absence of left breast and nipple: Secondary | ICD-10-CM

## 2016-09-07 NOTE — Progress Notes (Signed)
DATE:  09/07/2016   MRN:  [id number]  BIRTHDAY: May 07, 1917  Facility:  Nursing Home Location:  [place] Room Number: 908-P  LEVEL OF CARE:  SNF (31)  Contact Information    Name Relation Home Work Mobile   [place] Son [phone]         Code Status History    Date Active Date Inactive Code Status Order ID Comments User Context   01/21/2016  2:58 PM 01/22/2016  2:56 PM Partial Code [id number]  Excell Seltzer, MD Inpatient   11/16/2015  9:55 PM 11/18/2015  9:05 PM Full Code [id number]  Ivor Costa, MD ED    Questions for Most Recent Historical Code Status (Order [id number])    Question Answer Comment   In the event of cardiac or respiratory ARREST: Initiate Code Blue, Call Rapid Response No    In the event of cardiac or respiratory ARREST: Perform CPR No    In the event of cardiac or respiratory ARREST: Perform Intubation/Mechanical Ventilation No    In the event of cardiac or respiratory ARREST: Use NIPPV/BiPAp only if indicated No    In the event of cardiac or respiratory ARREST: Administer ACLS medications if indicated Yes    In the event of cardiac or respiratory ARREST: Perform Defibrillation or Cardioversion if indicated Yes         Advance Directive Documentation   Flowsheet Row Most Recent Value  Type of Advance Directive  Out of facility DNR (pink MOST or yellow form)  Pre-existing out of facility DNR order (yellow form or pink MOST form)  No data  "MOST" Form in Place?  No data       Chief Complaint  Patient presents with  . Medical Management of Chronic Issues    HISTORY OF PRESENT ILLNESS:  This is a 20-YO female seen for a routine visit.  She is a long-term care resident of [facility]. She was seen doing restorative therapy without any difficulty. She is alert and oriented X 3 and able to follow commands.    PAST MEDICAL HISTORY:  Past Medical History:  Diagnosis Date  . Arthritis   .  Breast cancer, left breast ([place])     invasive ductal carcinoma with erosion through the skin and a central ulceration lateral to the nipple measuring about 4 cm in diameter/notes 01/21/2016  . Complication of anesthesia   . Fall   . History of hiatal hernia   . PONV (postoperative nausea and vomiting)   . Protein calorie malnutrition ([place])      CURRENT MEDICATIONS: Reviewed  Patient's Medications  New Prescriptions   No medications on file  Previous Medications   ANASTROZOLE (ARIMIDEX) 1 MG TABLET    Take 1 mg by mouth daily.   CALCITONIN, SALMON, (MIACALCIN/FORTICAL) 200 UNIT/ACT NASAL SPRAY    Place 1 spray into alternate nostrils daily.    MENTHOL (ICY HOT) 5 % PTCH    Apply 1 each topically. Apply to lower back QAM and remove QHS.  Monitor skin for rash/irritation   ONDANSETRON (ZOFRAN) 4 MG TABLET    Take 4-8 mg by mouth every 8 (eight) hours as needed for nausea or vomiting.   POLYETHYLENE GLYCOL (MIRALAX / GLYCOLAX) PACKET    Take 17 g by mouth daily.   SENNA-DOCUSATE (SENOKOT-S) 8.6-50 MG TABLET    Take 1 tablet by mouth 2 (two) times daily.   TRAMADOL (ULTRAM) 50 MG TABLET    Take  50 mg by mouth every 12 (twelve) hours as needed for moderate pain or severe pain.    TRAMADOL (ULTRAM) 50 MG TABLET    Take 100 mg by mouth 2 (two) times daily. QAM and 9PM, hold for sedation   UNABLE TO FIND    Med Name: Med pass 120 mL BID between meals   VITAMIN B-12 (CYANOCOBALAMIN) 100 MCG TABLET    Take 100 mcg by mouth daily.   VITAMIN C (ASCORBIC ACID) 500 MG TABLET    Take 500 mg by mouth daily.   VITAMIN D, ERGOCALCIFEROL, (DRISDOL) 50000 UNITS CAPS CAPSULE    Take 50,000 Units by mouth every 7 (seven) days.  Modified Medications   No medications on file  Discontinued Medications   TRAMADOL (ULTRAM) 50 MG TABLET    Take two tablets by mouth every evening for pain; Take one tablet by mouth every 8 hours as needed for pain.     Allergies  Allergen Reactions  . No Known Allergies       REVIEW OF SYSTEMS:  GENERAL: no change in appetite, no fatigue, no weight changes, no fever, chills or weakness EYES: Denies change in vision, dry eyes, eye pain, itching or discharge EARS: Denies change in hearing, ringing in ears, or earache NOSE: Denies nasal congestion or epistaxis MOUTH and THROAT: Denies oral discomfort, gingival pain or bleeding, pain from teeth or hoarseness   RESPIRATORY: no cough, SOB, DOE, wheezing, hemoptysis CARDIAC: no chest pain, edema or palpitations GI: no abdominal pain, diarrhea, constipation, heart burn, nausea or vomiting GU: Denies dysuria, frequency, hematuria, incontinence, or discharge PSYCHIATRIC: Denies feeling of depression or anxiety. No report of hallucinations, insomnia, paranoia, or agitation    PHYSICAL EXAMINATION  GENERAL APPEARANCE: In no acute distress.  SKIN:  Skin is warm and dry.  HEAD: Normal in size and contour. No evidence of trauma EYES: Lids open and close normally. No blepharitis, entropion or ectropion. PERRL. Conjunctivae are clear and sclerae are white. Lenses are without opacity EARS: Pinnae are normal. Slightly hard of hearing MOUTH and THROAT: Lips are without lesions. Oral mucosa is moist and without lesions. Tongue is normal in shape, size, and color and without lesions NECK: supple, trachea midline, no neck masses, no thyroid tenderness, no thyromegaly LYMPHATICS: no LAN in the neck, no supraclavicular LAN RESPIRATORY: breathing is even & unlabored, BS CTAB CARDIAC: RRR, no murmur,no extra heart sounds, no edema GI: abdomen soft, normal BS, no masses, no tenderness, no hepatomegaly, no splenomegaly EXTREMITIES:  Able to move 4 extremities PSYCHIATRIC: Alert and oriented X 3. Affect and behavior are appropriate    LABS/RADIOLOGY: Labs reviewed: Basic Metabolic Panel:  Recent Labs  11/17/15 0418  11/18/15 0410 12/11/15 0037  01/22/16 0241  04/27/16 1437 05/07/16 08/16/16 08/24/16 1404  NA  --    < > 140 142  < > 140  < > 141 140 142 141  K  --   < > 3.3* 3.2*  < > 3.6  < > 4.3 4.3 4.0 4.5  CL  --   < > 109 109  --  104  --   --   --   --   --   CO2  --   < > 28 28  --  30  --  28  --   --  29  GLUCOSE  --   < > 98 101*  --  99  --  100  --   --  157*  BUN  --   < >  19 18  < > 19  < > 24.4 16 20  20.9  CREATININE  --   < > 0.43* 0.30*  < > 0.49  < > 0.7 0.5 0.5 0.7  CALCIUM  --   < > 8.3* 8.8*  --  9.1  --  9.3  --   --  9.8  MG 1.9  --   --   --   --   --   --   --   --   --   --   < > = values in this interval not displayed. Liver Function Tests:  Recent Labs  12/11/15 0037  04/27/16 1437 08/16/16 08/24/16 1404  AST 16  < > 14 16 15   ALT 11*  < > 10 9 12   ALKPHOS 88  < > 80 71 79  BILITOT 0.8  --  0.56  --  0.53  PROT 5.7*  --  6.6  --  7.1  ALBUMIN 3.4*  --  3.3*  --  3.5  < > = values in this interval not displayed. CBC:  Recent Labs  01/22/16 0241  04/27/16 1437 08/16/16 08/24/16 1404  WBC 7.5  < > 6.8 7.7 7.7  NEUTROABS  --   < > 4.8 6 5.8  HGB 11.0*  < > 13.7 12.9 13.8  HCT 34.1*  < > 42.5 40 42.7  MCV 99.1  --  95.4  --  97.5  PLT 179  < > 177 218 214  < > = values in this interval not displayed. Cardiac Enzymes:  Recent Labs  11/16/15 1659  CKTOTAL 551*    ASSESSMENT/PLAN:  Low back pain - well-controlled; continue tramadol 50 mg 2 tabs = 100 mg by mouth every morning and at bedtime, and 50 mg 1 tab by mouth every 12 hours when necessary and icy hot patch 5% to lower back every morning  Osteoporosis - continue Miacalcin 200 units 1 spray into left and right nare alternately  Vitamin D deficiency - continue vitamin D3 50,000 units by mouth every Fridays  Left breast invasive ductal carcinoma - S/P mastectomy ; follows up with oncology; continue anastrozole 1 mg by mouth daily     Goals of care:  Long-term care    Dory Verdun C. [place] - NP    [employer] [phone]

## 2016-10-05 ENCOUNTER — Encounter: Payer: Self-pay | Admitting: Adult Health

## 2016-10-05 ENCOUNTER — Non-Acute Institutional Stay (SKILLED_NURSING_FACILITY): Payer: Medicare Other | Admitting: Adult Health

## 2016-10-05 DIAGNOSIS — R110 Nausea: Secondary | ICD-10-CM

## 2016-10-05 DIAGNOSIS — M810 Age-related osteoporosis without current pathological fracture: Secondary | ICD-10-CM

## 2016-10-05 DIAGNOSIS — K5901 Slow transit constipation: Secondary | ICD-10-CM

## 2016-10-05 DIAGNOSIS — E43 Unspecified severe protein-calorie malnutrition: Secondary | ICD-10-CM

## 2016-10-05 NOTE — Progress Notes (Signed)
DATE:  10/05/2016   MRN:  [id number]  BIRTHDAY: 06-Jun-1917  Facility:  Nursing Home Location:  [place] Room Number: 908-P  LEVEL OF CARE:  SNF (31)  Contact Information    Name Relation Home Work Mobile   [place] Son [phone]         Code Status History    Date Active Date Inactive Code Status Order ID Comments User Context   01/21/2016  2:58 PM 01/22/2016  2:56 PM Partial Code [id number]  Excell Seltzer, MD Inpatient   11/16/2015  9:55 PM 11/18/2015  9:05 PM Full Code [id number]  Ivor Costa, MD ED    Questions for Most Recent Historical Code Status (Order [id number])    Question Answer Comment   In the event of cardiac or respiratory ARREST: Initiate Code Blue, Call Rapid Response No    In the event of cardiac or respiratory ARREST: Perform CPR No    In the event of cardiac or respiratory ARREST: Perform Intubation/Mechanical Ventilation No    In the event of cardiac or respiratory ARREST: Use NIPPV/BiPAp only if indicated No    In the event of cardiac or respiratory ARREST: Administer ACLS medications if indicated Yes    In the event of cardiac or respiratory ARREST: Perform Defibrillation or Cardioversion if indicated Yes         Advance Directive Documentation     Most Recent Value  Type of Advance Directive  Out of facility DNR (pink MOST or yellow form)  Pre-existing out of facility DNR order (yellow form or pink MOST form)  -  "MOST" Form in Place?  -       Chief Complaint  Patient presents with  . Medical Management of Chronic Issues    HISTORY OF PRESENT ILLNESS:  This is a 65-YO female seen for a routine visit.  She is a long-term care resident of [facility]. She was recently discharged from PT services. Latest weight is 87.4 lbs, gained 1.4 lbs from last month with Body mass index is 15.08 kg/m.   PAST MEDICAL HISTORY:  Past Medical History:  Diagnosis Date  . Arthritis   . Breast  cancer, left breast ([place])     invasive ductal carcinoma with erosion through the skin and a central ulceration lateral to the nipple measuring about 4 cm in diameter/notes 01/21/2016  . Complication of anesthesia   . Fall   . History of hiatal hernia   . PONV (postoperative nausea and vomiting)   . Protein calorie malnutrition ([place])      CURRENT MEDICATIONS: Reviewed  Patient's Medications  New Prescriptions   No medications on file  Previous Medications   ANASTROZOLE (ARIMIDEX) 1 MG TABLET    Take 1 mg by mouth daily.   CALCITONIN, SALMON, (MIACALCIN/FORTICAL) 200 UNIT/ACT NASAL SPRAY    Place 1 spray into alternate nostrils daily.    MENTHOL (ICY HOT) 5 % PTCH    Apply 1 each topically. Apply to lower back QAM and remove QHS.  Monitor skin for rash/irritation   ONDANSETRON (ZOFRAN) 4 MG TABLET    Take 4-8 mg by mouth every 8 (eight) hours as needed for nausea or vomiting.   POLYETHYLENE GLYCOL (MIRALAX / GLYCOLAX) PACKET    Take 17 g by mouth daily.   SENNA-DOCUSATE (SENOKOT-S) 8.6-50 MG TABLET    Take 1 tablet by mouth 2 (two) times daily.   TRAMADOL (ULTRAM) 50 MG TABLET  Take 50 mg by mouth every 12 (twelve) hours as needed for moderate pain or severe pain.    TRAMADOL (ULTRAM) 50 MG TABLET    Take 100 mg by mouth 2 (two) times daily. QAM and 9PM, hold for sedation   UNABLE TO FIND    Med Name: Med pass 120 mL TID between meals   VITAMIN B-12 (CYANOCOBALAMIN) 100 MCG TABLET    Take 100 mcg by mouth daily.   VITAMIN C (ASCORBIC ACID) 500 MG TABLET    Take 500 mg by mouth daily.   VITAMIN D, ERGOCALCIFEROL, (DRISDOL) 50000 UNITS CAPS CAPSULE    Take 50,000 Units by mouth every 7 (seven) days.  Modified Medications   No medications on file  Discontinued Medications   No medications on file     Allergies  Allergen Reactions  . No Known Allergies      REVIEW OF SYSTEMS:  GENERAL:  no fever, chills or weakness EYES: Denies change in vision, dry eyes, eye pain, itching or  discharge EARS: Denies change in hearing, ringing in ears, or earache NOSE: Denies nasal congestion or epistaxis MOUTH and THROAT: Denies oral discomfort, gingival pain or bleeding, pain from teeth or hoarseness   RESPIRATORY: no cough, SOB, DOE, wheezing, hemoptysis CARDIAC: no chest pain, edema or palpitations GI: no abdominal pain, diarrhea, constipation, heart burn, nausea or vomiting GU: Denies dysuria, frequency, hematuria, incontinence, or discharge PSYCHIATRIC: Denies feeling of depression or anxiety. No report of hallucinations, insomnia, paranoia, or agitation   PHYSICAL EXAMINATION  GENERAL APPEARANCE:  In no acute distress.  SKIN:  Skin is warm and dry.  HEAD: Normal in size and contour. No evidence of trauma EYES: Lids open and close normally. No blepharitis, entropion or ectropion. PERRL. Conjunctivae are clear and sclerae are white. Lenses are without opacity EARS: Pinnae are normal. Patient hears normal voice tunes of the examiner MOUTH and THROAT: Lips are without lesions. Oral mucosa is moist and without lesions. Tongue is normal in shape, size, and color and without lesions NECK: supple, trachea midline, no neck masses, no thyroid tenderness, no thyromegaly LYMPHATICS: no LAN in the neck, no supraclavicular LAN RESPIRATORY: breathing is even & unlabored, BS CTAB CARDIAC: RRR, no murmur,no extra heart sounds, no edema GI: abdomen soft, normal BS, no masses, no tenderness, no hepatomegaly, no splenomegaly EXTREMITIES:  Able to move X 4 extremities PSYCHIATRIC: Alert and oriented X 3. Affect and behavior are appropriate   LABS/RADIOLOGY: Labs reviewed: Basic Metabolic Panel:  Recent Labs  11/17/15 0418  11/18/15 0410 12/11/15 0037  01/22/16 0241  04/27/16 1437 05/07/16 08/16/16 08/24/16 1404  NA  --   < > 140 142  < > 140  < > 141 140 142  142 141  K  --   < > 3.3* 3.2*  < > 3.6  < > 4.3 4.3 4.0  4.0 4.5  CL  --   < > 109 109  --  104  --   --   --   --    --   CO2  --   < > 28 28  --  30  --  28  --   --  29  GLUCOSE  --   < > 98 101*  --  99  --  100  --   --  157*  BUN  --   < > 19 18  < > 19  < > 24.4 16 20  20  20.9  CREATININE  --   < >  0.43* 0.30*  < > 0.49  < > 0.7 0.5 0.5  0.5 0.7  CALCIUM  --   < > 8.3* 8.8*  --  9.1  --  9.3  --   --  9.8  MG 1.9  --   --   --   --   --   --   --   --   --   --   < > = values in this interval not displayed. Liver Function Tests:  Recent Labs  12/11/15 0037  04/27/16 1437 08/16/16 08/24/16 1404  AST 16  < > 14 16  16 15   ALT 11*  < > 10 9  9 12   ALKPHOS 88  < > 80 71  71 79  BILITOT 0.8  --  0.56  --  0.53  PROT 5.7*  --  6.6  --  7.1  ALBUMIN 3.4*  --  3.3*  --  3.5  < > = values in this interval not displayed. CBC:  Recent Labs  01/22/16 0241  04/27/16 1437 08/16/16 08/24/16 1404  WBC 7.5  < > 6.8 7.7  7.7 7.7  NEUTROABS  --   < > 4.8 6  6  5.8  HGB 11.0*  < > 13.7 12.9  12.9 13.8  HCT 34.1*  < > 42.5 40  40 42.7  MCV 99.1  --  95.4  --  97.5  PLT 179  < > 177 218  218 214  < > = values in this interval not displayed. Cardiac Enzymes:  Recent Labs  11/16/15 1659  CKTOTAL 551*    ASSESSMENT/PLAN:  Protein-calorie malnutrition, severe - medpass was recently increased to 120 ml TID Body mass index is 15.08 kg/m.  Constipation - continue Miralax 17 gm PO Q D, Senna-S 1 tab PO BID  Nausea - continue Zofran 4 mg 1 tab PO Q 8 hours PRN  Osteoporosis - continue Miacalcin 200 units 1 spray into alternating nostrils daily and Vitamin D3 5,000units PO Q Fridays     Goals of care:  Long-term care   Lynden Flemmer C. [place] - NP    [employer] [phone]

## 2016-10-22 DIAGNOSIS — M6281 Muscle weakness (generalized): Secondary | ICD-10-CM | POA: Diagnosis not present

## 2016-10-22 DIAGNOSIS — Z9181 History of falling: Secondary | ICD-10-CM | POA: Diagnosis not present

## 2016-11-01 ENCOUNTER — Encounter: Payer: Self-pay | Admitting: Adult Health

## 2016-11-01 ENCOUNTER — Non-Acute Institutional Stay (SKILLED_NURSING_FACILITY): Payer: Medicare Other | Admitting: Adult Health

## 2016-11-01 DIAGNOSIS — K029 Dental caries, unspecified: Secondary | ICD-10-CM

## 2016-11-01 DIAGNOSIS — C50912 Malignant neoplasm of unspecified site of left female breast: Secondary | ICD-10-CM

## 2016-11-01 DIAGNOSIS — K5901 Slow transit constipation: Secondary | ICD-10-CM | POA: Diagnosis not present

## 2016-11-01 DIAGNOSIS — E559 Vitamin D deficiency, unspecified: Secondary | ICD-10-CM

## 2016-11-01 NOTE — Progress Notes (Signed)
DATE:  11/01/2016   MRN:  [id number]  BIRTHDAY: 1917-02-07  Facility:  Nursing Home Location:  [place] Room Number: 908-P  LEVEL OF CARE:  SNF (31)  Contact Information    Name Relation Home Work Mobile   [place] Son [phone]         Code Status History    Date Active Date Inactive Code Status Order ID Comments User Context   01/21/2016  2:58 PM 01/22/2016  2:56 PM Partial Code [id number]  Excell Seltzer, MD Inpatient   11/16/2015  9:55 PM 11/18/2015  9:05 PM Full Code [id number]  Ivor Costa, MD ED    Questions for Most Recent Historical Code Status (Order [id number])    Question Answer Comment   In the event of cardiac or respiratory ARREST: Initiate Code Blue, Call Rapid Response No    In the event of cardiac or respiratory ARREST: Perform CPR No    In the event of cardiac or respiratory ARREST: Perform Intubation/Mechanical Ventilation No    In the event of cardiac or respiratory ARREST: Use NIPPV/BiPAp only if indicated No    In the event of cardiac or respiratory ARREST: Administer ACLS medications if indicated Yes    In the event of cardiac or respiratory ARREST: Perform Defibrillation or Cardioversion if indicated Yes         Advance Directive Documentation     Most Recent Value  Type of Advance Directive  Out of facility DNR (pink MOST or yellow form)  Pre-existing out of facility DNR order (yellow form or pink MOST form)  -  "MOST" Form in Place?  -       Chief Complaint  Patient presents with  . Medical Management of Chronic Issues    HISTORY OF PRESENT ILLNESS:  This is a 64-YO female seen for a routine visit.  She is a long-term care resident of [facility]. She was seen in her room today and verbalized concerns about her frontal dental caries.   PAST MEDICAL HISTORY:  Past Medical History:  Diagnosis Date  . Arthritis   . Breast cancer, left breast ([place])     invasive ductal  carcinoma with erosion through the skin and a central ulceration lateral to the nipple measuring about 4 cm in diameter/notes 01/21/2016  . Complication of anesthesia   . Fall   . History of hiatal hernia   . PONV (postoperative nausea and vomiting)   . Protein calorie malnutrition ([place])      CURRENT MEDICATIONS: Reviewed  Patient's Medications  New Prescriptions   No medications on file  Previous Medications   ANASTROZOLE (ARIMIDEX) 1 MG TABLET    Take 1 mg by mouth daily.   CALCITONIN, SALMON, (MIACALCIN/FORTICAL) 200 UNIT/ACT NASAL SPRAY    Place 1 spray into alternate nostrils daily.    MENTHOL (ICY HOT) 5 % PTCH    Apply 1 each topically. Apply to lower back QAM and remove QHS.  Monitor skin for rash/irritation   ONDANSETRON (ZOFRAN) 4 MG TABLET    Take 4-8 mg by mouth every 8 (eight) hours as needed for nausea or vomiting.   POLYETHYLENE GLYCOL (MIRALAX / GLYCOLAX) PACKET    Take 17 g by mouth daily.   SENNA-DOCUSATE (SENOKOT-S) 8.6-50 MG TABLET    Take 1 tablet by mouth 2 (two) times daily.   TRAMADOL (ULTRAM) 50 MG TABLET    Take 50 mg by mouth every 12 (twelve) hours as  needed for moderate pain or severe pain.    TRAMADOL (ULTRAM) 50 MG TABLET    Take 100 mg by mouth 2 (two) times daily. QAM and 9PM, hold for sedation   UNABLE TO FIND    Med Name: Med pass 120 mL TID between meals   VITAMIN B-12 (CYANOCOBALAMIN) 100 MCG TABLET    Take 100 mcg by mouth daily.   VITAMIN C (ASCORBIC ACID) 500 MG TABLET    Take 500 mg by mouth daily.   VITAMIN D, ERGOCALCIFEROL, (DRISDOL) 50000 UNITS CAPS CAPSULE    Take 50,000 Units by mouth every 7 (seven) days.  Modified Medications   No medications on file  Discontinued Medications   No medications on file     Allergies  Allergen Reactions  . No Known Allergies      REVIEW OF SYSTEMS:  GENERAL: no fever, chills or weakness EYES: Denies change in vision, dry eyes, eye pain, itching or discharge EARS: Denies change in hearing, ringing  in ears, or earache NOSE: Denies nasal congestion or epistaxis MOUTH and THROAT: Denies oral discomfort, gingival pain or bleeding, pain from teeth or hoarseness   RESPIRATORY: no cough, SOB, DOE, wheezing, hemoptysis CARDIAC: no chest pain, edema or palpitations GI: no abdominal pain, diarrhea, constipation, heart burn, nausea or vomiting GU: Denies dysuria, frequency, hematuria, incontinence, or discharge PSYCHIATRIC: Denies feeling of depression or anxiety. No report of hallucinations, insomnia, paranoia, or agitation   PHYSICAL EXAMINATION  GENERAL APPEARANCE:  In no acute distress.  SKIN:  Skin is warm and dry.  HEAD: Normal in size and contour. No evidence of trauma EYES: Lids open and close normally. No blepharitis, entropion or ectropion. PERRL. Conjunctivae are clear and sclerae are white. Lenses are without opacity EARS: Pinnae are normal. Patient hears normal voice tunes of the examiner MOUTH and THROAT: Lips are without lesions. Oral mucosa is moist and without lesions. Tongue is normal in shape, size, and color and without lesions NECK: supple, trachea midline, no neck masses, no thyroid tenderness, no thyromegaly LYMPHATICS: no LAN in the neck, no supraclavicular LAN RESPIRATORY: breathing is even & unlabored, BS CTAB CHEST:  Old left mastectomy CARDIAC: RRR, no murmur,no extra heart sounds, no  edema GI: abdomen soft, normal BS, no masses, no tenderness, no hepatomegaly, no splenomegaly EXTREMITIES:  Able to move X 4 extremities PSYCHIATRIC: Alert and oriented X 3. Affect and behavior are appropriate   LABS/RADIOLOGY: Labs reviewed: Basic Metabolic Panel:  Recent Labs  11/17/15 0418  11/18/15 0410 12/11/15 0037  01/22/16 0241  04/27/16 1437 05/07/16 08/16/16 08/24/16 1404  NA  --   < > 140 142  < > 140  < > 141 140 142  142 141  K  --   < > 3.3* 3.2*  < > 3.6  < > 4.3 4.3 4.0  4.0 4.5  CL  --   < > 109 109  --  104  --   --   --   --   --   CO2  --   < >  28 28  --  30  --  28  --   --  29  GLUCOSE  --   < > 98 101*  --  99  --  100  --   --  157*  BUN  --   < > 19 18  < > 19  < > 24.4 16 20  20  20.9  CREATININE  --   < >  0.43* 0.30*  < > 0.49  < > 0.7 0.5 0.5  0.5 0.7  CALCIUM  --   < > 8.3* 8.8*  --  9.1  --  9.3  --   --  9.8  MG 1.9  --   --   --   --   --   --   --   --   --   --   < > = values in this interval not displayed. Liver Function Tests:  Recent Labs  12/11/15 0037  04/27/16 1437 08/16/16 08/24/16 1404  AST 16  < > 14 16  16 15   ALT 11*  < > 10 9  9 12   ALKPHOS 88  < > 80 71  71 79  BILITOT 0.8  --  0.56  --  0.53  PROT 5.7*  --  6.6  --  7.1  ALBUMIN 3.4*  --  3.3*  --  3.5  < > = values in this interval not displayed. CBC:  Recent Labs  01/22/16 0241  04/27/16 1437 08/16/16 08/24/16 1404  WBC 7.5  < > 6.8 7.7  7.7 7.7  NEUTROABS  --   < > 4.8 6  6  5.8  HGB 11.0*  < > 13.7 12.9  12.9 13.8  HCT 34.1*  < > 42.5 40  40 42.7  MCV 99.1  --  95.4  --  97.5  PLT 179  < > 177 218  218 214  < > = values in this interval not displayed. Cardiac Enzymes:  Recent Labs  11/16/15 1659  CKTOTAL 551*    ASSESSMENT/PLAN:  Dental caries - oral care daily, dental consult  Left breast invasive ductal carcinoma - S/P mastectomy ; follows up with oncology; continue anastrozole 1 mg by mouth daily  Slow transit constipation -  continue MiraLAX 17 g by mouth daily and senna S1 tab by mouth twice a day  Vitamin D deficiency - continue vitamin D3 50,000 units by mouth every Fridays    Goals of care:  Long-term care    Aaleeyah Bias C. [place] - NP    [employer] [phone]

## 2016-11-10 DIAGNOSIS — M6281 Muscle weakness (generalized): Secondary | ICD-10-CM | POA: Diagnosis not present

## 2016-11-10 DIAGNOSIS — R2681 Unsteadiness on feet: Secondary | ICD-10-CM | POA: Diagnosis not present

## 2016-11-10 DIAGNOSIS — M545 Low back pain: Secondary | ICD-10-CM | POA: Diagnosis not present

## 2016-11-15 DIAGNOSIS — M6281 Muscle weakness (generalized): Secondary | ICD-10-CM | POA: Diagnosis not present

## 2016-11-15 DIAGNOSIS — R2681 Unsteadiness on feet: Secondary | ICD-10-CM | POA: Diagnosis not present

## 2016-11-15 DIAGNOSIS — M25552 Pain in left hip: Secondary | ICD-10-CM | POA: Diagnosis not present

## 2016-11-15 DIAGNOSIS — M545 Low back pain: Secondary | ICD-10-CM | POA: Diagnosis not present

## 2016-11-16 DIAGNOSIS — M545 Low back pain: Secondary | ICD-10-CM | POA: Diagnosis not present

## 2016-11-16 DIAGNOSIS — R2681 Unsteadiness on feet: Secondary | ICD-10-CM | POA: Diagnosis not present

## 2016-11-16 DIAGNOSIS — M6281 Muscle weakness (generalized): Secondary | ICD-10-CM | POA: Diagnosis not present

## 2016-11-18 DIAGNOSIS — Z79899 Other long term (current) drug therapy: Secondary | ICD-10-CM | POA: Diagnosis not present

## 2016-11-18 DIAGNOSIS — I10 Essential (primary) hypertension: Secondary | ICD-10-CM | POA: Diagnosis not present

## 2016-11-18 LAB — VITAMIN D 25 HYDROXY (VIT D DEFICIENCY, FRACTURES): VIT D 25 HYDROXY: 23.98

## 2016-11-18 LAB — VITAMIN B12: Vitamin B-12: 627

## 2016-11-26 DIAGNOSIS — M6281 Muscle weakness (generalized): Secondary | ICD-10-CM | POA: Diagnosis not present

## 2016-11-26 DIAGNOSIS — Z9181 History of falling: Secondary | ICD-10-CM | POA: Diagnosis not present

## 2016-11-26 DIAGNOSIS — R2681 Unsteadiness on feet: Secondary | ICD-10-CM | POA: Diagnosis not present

## 2016-11-29 DIAGNOSIS — M6281 Muscle weakness (generalized): Secondary | ICD-10-CM | POA: Diagnosis not present

## 2016-11-29 DIAGNOSIS — R2681 Unsteadiness on feet: Secondary | ICD-10-CM | POA: Diagnosis not present

## 2016-11-29 DIAGNOSIS — Z9181 History of falling: Secondary | ICD-10-CM | POA: Diagnosis not present

## 2016-11-30 DIAGNOSIS — Z9181 History of falling: Secondary | ICD-10-CM | POA: Diagnosis not present

## 2016-11-30 DIAGNOSIS — R2681 Unsteadiness on feet: Secondary | ICD-10-CM | POA: Diagnosis not present

## 2016-11-30 DIAGNOSIS — M6281 Muscle weakness (generalized): Secondary | ICD-10-CM | POA: Diagnosis not present

## 2016-12-01 DIAGNOSIS — Z9181 History of falling: Secondary | ICD-10-CM | POA: Diagnosis not present

## 2016-12-01 DIAGNOSIS — R2681 Unsteadiness on feet: Secondary | ICD-10-CM | POA: Diagnosis not present

## 2016-12-01 DIAGNOSIS — M6281 Muscle weakness (generalized): Secondary | ICD-10-CM | POA: Diagnosis not present

## 2016-12-02 DIAGNOSIS — M6281 Muscle weakness (generalized): Secondary | ICD-10-CM | POA: Diagnosis not present

## 2016-12-02 DIAGNOSIS — Z9181 History of falling: Secondary | ICD-10-CM | POA: Diagnosis not present

## 2016-12-02 DIAGNOSIS — R2681 Unsteadiness on feet: Secondary | ICD-10-CM | POA: Diagnosis not present

## 2016-12-03 DIAGNOSIS — M6281 Muscle weakness (generalized): Secondary | ICD-10-CM | POA: Diagnosis not present

## 2016-12-03 DIAGNOSIS — Z9181 History of falling: Secondary | ICD-10-CM | POA: Diagnosis not present

## 2016-12-03 DIAGNOSIS — R2681 Unsteadiness on feet: Secondary | ICD-10-CM | POA: Diagnosis not present

## 2016-12-06 ENCOUNTER — Ambulatory Visit
Admission: RE | Admit: 2016-12-06 | Discharge: 2016-12-06 | Disposition: A | Discharge status: Home / Self Care | Payer: Medicare Other | Source: Ambulatory Visit | Attending: Hematology | Admitting: Hematology

## 2016-12-06 DIAGNOSIS — Z9181 History of falling: Secondary | ICD-10-CM | POA: Diagnosis not present

## 2016-12-06 DIAGNOSIS — Z9012 Acquired absence of left breast and nipple: Secondary | ICD-10-CM

## 2016-12-06 DIAGNOSIS — M6281 Muscle weakness (generalized): Secondary | ICD-10-CM | POA: Diagnosis not present

## 2016-12-06 DIAGNOSIS — Z1231 Encounter for screening mammogram for malignant neoplasm of breast: Secondary | ICD-10-CM | POA: Diagnosis not present

## 2016-12-06 DIAGNOSIS — R2681 Unsteadiness on feet: Secondary | ICD-10-CM | POA: Diagnosis not present

## 2016-12-07 DIAGNOSIS — M6281 Muscle weakness (generalized): Secondary | ICD-10-CM | POA: Diagnosis not present

## 2016-12-07 DIAGNOSIS — R2681 Unsteadiness on feet: Secondary | ICD-10-CM | POA: Diagnosis not present

## 2016-12-07 DIAGNOSIS — Z9181 History of falling: Secondary | ICD-10-CM | POA: Diagnosis not present

## 2016-12-08 DIAGNOSIS — M6281 Muscle weakness (generalized): Secondary | ICD-10-CM | POA: Diagnosis not present

## 2016-12-08 DIAGNOSIS — R2681 Unsteadiness on feet: Secondary | ICD-10-CM | POA: Diagnosis not present

## 2016-12-08 DIAGNOSIS — Z9181 History of falling: Secondary | ICD-10-CM | POA: Diagnosis not present

## 2016-12-09 DIAGNOSIS — Z9181 History of falling: Secondary | ICD-10-CM | POA: Diagnosis not present

## 2016-12-09 DIAGNOSIS — R2681 Unsteadiness on feet: Secondary | ICD-10-CM | POA: Diagnosis not present

## 2016-12-09 DIAGNOSIS — M6281 Muscle weakness (generalized): Secondary | ICD-10-CM | POA: Diagnosis not present

## 2016-12-10 DIAGNOSIS — R2681 Unsteadiness on feet: Secondary | ICD-10-CM | POA: Diagnosis not present

## 2016-12-10 DIAGNOSIS — Z9181 History of falling: Secondary | ICD-10-CM | POA: Diagnosis not present

## 2016-12-10 DIAGNOSIS — M6281 Muscle weakness (generalized): Secondary | ICD-10-CM | POA: Diagnosis not present

## 2016-12-13 DIAGNOSIS — M6281 Muscle weakness (generalized): Secondary | ICD-10-CM | POA: Diagnosis not present

## 2016-12-13 DIAGNOSIS — Z9181 History of falling: Secondary | ICD-10-CM | POA: Diagnosis not present

## 2016-12-13 DIAGNOSIS — R2681 Unsteadiness on feet: Secondary | ICD-10-CM | POA: Diagnosis not present

## 2016-12-14 DIAGNOSIS — M6281 Muscle weakness (generalized): Secondary | ICD-10-CM | POA: Diagnosis not present

## 2016-12-14 DIAGNOSIS — Z9181 History of falling: Secondary | ICD-10-CM | POA: Diagnosis not present

## 2016-12-14 DIAGNOSIS — R2681 Unsteadiness on feet: Secondary | ICD-10-CM | POA: Diagnosis not present

## 2016-12-15 DIAGNOSIS — M6281 Muscle weakness (generalized): Secondary | ICD-10-CM | POA: Diagnosis not present

## 2016-12-15 DIAGNOSIS — Z9181 History of falling: Secondary | ICD-10-CM | POA: Diagnosis not present

## 2016-12-15 DIAGNOSIS — R2681 Unsteadiness on feet: Secondary | ICD-10-CM | POA: Diagnosis not present

## 2016-12-21 DIAGNOSIS — M6281 Muscle weakness (generalized): Secondary | ICD-10-CM | POA: Diagnosis not present

## 2016-12-21 DIAGNOSIS — M545 Low back pain: Secondary | ICD-10-CM | POA: Diagnosis not present

## 2016-12-21 DIAGNOSIS — R2681 Unsteadiness on feet: Secondary | ICD-10-CM | POA: Diagnosis not present

## 2016-12-24 DIAGNOSIS — Z79899 Other long term (current) drug therapy: Secondary | ICD-10-CM | POA: Diagnosis not present

## 2016-12-24 DIAGNOSIS — D649 Anemia, unspecified: Secondary | ICD-10-CM | POA: Diagnosis not present

## 2016-12-27 ENCOUNTER — Other Ambulatory Visit (HOSPITAL_BASED_OUTPATIENT_CLINIC_OR_DEPARTMENT_OTHER): Payer: Medicare Other

## 2016-12-27 ENCOUNTER — Telehealth: Payer: Self-pay | Admitting: Hematology

## 2016-12-27 ENCOUNTER — Ambulatory Visit (HOSPITAL_BASED_OUTPATIENT_CLINIC_OR_DEPARTMENT_OTHER): Payer: Medicare Other | Admitting: Hematology

## 2016-12-27 ENCOUNTER — Encounter: Payer: Self-pay | Admitting: Hematology

## 2016-12-27 VITALS — BP 161/77 | HR 91 | Temp 97.7°F | Resp 16 | Ht 65.0 in | Wt 89.6 lb

## 2016-12-27 DIAGNOSIS — E46 Unspecified protein-calorie malnutrition: Secondary | ICD-10-CM | POA: Diagnosis not present

## 2016-12-27 DIAGNOSIS — R634 Abnormal weight loss: Secondary | ICD-10-CM | POA: Diagnosis not present

## 2016-12-27 DIAGNOSIS — C50112 Malignant neoplasm of central portion of left female breast: Secondary | ICD-10-CM

## 2016-12-27 DIAGNOSIS — M1990 Unspecified osteoarthritis, unspecified site: Secondary | ICD-10-CM

## 2016-12-27 DIAGNOSIS — Z9181 History of falling: Secondary | ICD-10-CM

## 2016-12-27 DIAGNOSIS — Z170 Estrogen receptor positive status [ER+]: Secondary | ICD-10-CM

## 2016-12-27 LAB — COMPREHENSIVE METABOLIC PANEL
ALBUMIN: 3.7 g/dL (ref 3.5–5.0)
ALT: 13 U/L (ref 0–55)
AST: 16 U/L (ref 5–34)
Alkaline Phosphatase: 79 U/L (ref 40–150)
Anion Gap: 13 mEq/L — ABNORMAL HIGH (ref 3–11)
BILIRUBIN TOTAL: 0.66 mg/dL (ref 0.20–1.20)
BUN: 15.5 mg/dL (ref 7.0–26.0)
CO2: 28 meq/L (ref 22–29)
CREATININE: 0.7 mg/dL (ref 0.6–1.1)
Calcium: 9.8 mg/dL (ref 8.4–10.4)
Chloride: 104 mEq/L (ref 98–109)
EGFR: 74 mL/min/{1.73_m2} — AB (ref >90)
GLUCOSE: 160 mg/dL — AB (ref 70–140)
Potassium: 3.5 mEq/L (ref 3.5–5.1)
SODIUM: 146 meq/L — AB (ref 136–145)
TOTAL PROTEIN: 6.8 g/dL (ref 6.4–8.3)

## 2016-12-27 LAB — CBC WITH DIFFERENTIAL/PLATELET
BASO%: 0.3 % (ref 0.0–2.0)
Basophils Absolute: 0 10*3/uL (ref 0.0–0.1)
EOS%: 3.4 % (ref 0.0–7.0)
Eosinophils Absolute: 0.2 10*3/uL (ref 0.0–0.5)
HCT: 41.9 % (ref 34.8–46.6)
HEMOGLOBIN: 14 g/dL (ref 11.6–15.9)
LYMPH%: 19.7 % (ref 14.0–49.7)
MCH: 33.1 pg (ref 25.1–34.0)
MCHC: 33.4 g/dL (ref 31.5–36.0)
MCV: 99.1 fL (ref 79.5–101.0)
MONO#: 0.4 10*3/uL (ref 0.1–0.9)
MONO%: 5.2 % (ref 0.0–14.0)
NEUT%: 71.4 % (ref 38.4–76.8)
NEUTROS ABS: 5.2 10*3/uL (ref 1.5–6.5)
Platelets: 154 10*3/uL (ref 145–400)
RBC: 4.23 10*6/uL (ref 3.70–5.45)
RDW: 15 % — AB (ref 11.2–14.5)
WBC: 7.3 10*3/uL (ref 3.9–10.3)
lymph#: 1.4 10*3/uL (ref 0.9–3.3)

## 2016-12-27 NOTE — Telephone Encounter (Signed)
Scheduled appt per 7/2 los - Gave patient AVS and calender per los.  

## 2016-12-27 NOTE — Progress Notes (Signed)
[facility]  [phone] [phone]  Clinic Follow up Note   Patient Care Team: Deland Pretty, MD as PCP - General (Internal Medicine) Truitt Merle, MD as Consulting Physician (Hematology) Excell Seltzer, MD as Consulting Physician (General Surgery) Gardenia Phlegm, NP as Nurse Practitioner (Hematology and Oncology) 12/27/2016  SUMMARY OF ONCOLOGIC HISTORY: Oncology History   Cancer of central portion of left female breast [facility])   Staging form: Breast, AJCC 7th Edition   - Clinical stage from 12/05/2015: Stage IIB (T3, N0, M0) - Signed by Truitt Merle, MD on 12/22/2015   - Pathologic stage from 01/21/2016: Stage IIIB (T4b, N1a, cM0) - Signed by Truitt Merle, MD on 02/18/2016       Cancer of central portion of left female breast ([place])   12/05/2015 Initial Diagnosis    Cancer of central portion of left female breast ([place])      12/05/2015 Mammogram    Your irregular fungating mass within the retroviral left breast, extending to the upper inner quadrant of the left breast, at least a 5 cm in greatest dimension, with obvious skin involvement, left axillary nodes were negative by ultrasound.      12/05/2015 Initial Biopsy    Left member breast mass biopsy showed invasive lobular carcinoma      12/05/2015 Receptors her2    ER 95% positive, PR 95% positive, HER-2 negative, Ki-67 12%,      01/21/2016 Surgery    Left mastectomy and SLN biopsy       01/21/2016 Pathology Results    Left mastectomy showed G2 invasive lobular carcinoma with skin involvement, at least two foci, the larger spans 5cm, (+) LVI, posterior margin broadly <0.1cm, 1 out of 2 nodes positive for cancer       03/12/2016 -  Anti-estrogen oral therapy    Anastrozole 1 mg daily      12/07/2016 Mammogram    Mammogram screening 12/07/16 IMPRESSION: No mammographic evidence of malignancy. A result letter of this screening mammogram will be mailed directly to the patient.      History  of present illness (11/17/2015):  Ms Najera is a 83 year old Caucasian female, with past medical history of hearing loss, arthritis, presented to the emergency room after a fall at home. She lives alone. She also has semi-dysuria, no fever at home. In the ER, urine test was positive for UTI. She was also found to have a left breast mass, which she reports it has been there for the past 2-3 months. I was called to evaluate her left breast mass. She denies significant pain in the left breast, no discharge.   She has a son who lives in [place]. She has not told anybody about that her left breast mass, she is not ready to discuss this with her son yet. She states " I do not want to fight, I'm 83 year old." She lives independently, able to take care of herself and function well at home.   CURRENT THERAPY:  anastrozole 35m daily, started on 03/12/2016  INTERVAL HISTORY:  FCirereturns for follow-up. She presents in the clinic today in her wheelchair with her son. She reports to having a lower appetite but eating 3 meals a day still. She has back pain and uses icy hot and menthol for the pain. She is exercising and trying to walk everyday. She uses ambulatory belt to help walk. She has not fallen lately but has fallen getting out of bed a couple of times. She did not  like that she was woken up to get blood taken.    REVIEW OF SYSTEMS:    Constitutional: Denies fevers, chills (+) loss of appetite Eyes: Denies blurriness of vision Ears, nose, mouth, throat, and face: Denies mucositis or sore throat Respiratory: Denies cough, dyspnea or wheezes Cardiovascular: Denies palpitation, chest discomfort or lower extremity swelling Gastrointestinal:  Denies nausea, heartburn or change in bowel habits Skin: Denies abnormal skin rashes Lymphatics: Denies new lymphadenopathy or easy bruising Neurological:Denies numbness, tingling or new weaknesses Behavioral/Psych: Mood is stable, no new changes    Musculoskeletal: (+) back pain All other systems were reviewed with the patient and are negative.  MEDICAL HISTORY:  Past Medical History:  Diagnosis Date  . Arthritis   . Breast cancer, left breast ([place])     invasive ductal carcinoma with erosion through the skin and a central ulceration lateral to the nipple measuring about 4 cm in diameter/notes 01/21/2016  . Complication of anesthesia   . Fall   . History of hiatal hernia   . PONV (postoperative nausea and vomiting)   . Protein calorie malnutrition ([facility])     SURGICAL HISTORY: Past Surgical History:  Procedure Laterality Date  . ABDOMINAL HYSTERECTOMY    . APPENDECTOMY    . CHOLECYSTECTOMY    . EYE SURGERY     cataracts bilateral eyes  . FRACTURE SURGERY Right    wrist surgery, cadaver implant  . MASTECTOMY COMPLETE / SIMPLE Left 01/21/2016  . TONSILLECTOMY    . TOTAL MASTECTOMY Left 01/21/2016   Procedure: LEFT TOTAL MASTECTOMY;  Surgeon: Excell Seltzer, MD;  Location: [facility];  Service: General;  Laterality: Left;    I have reviewed the social history and family history with the patient and they are unchanged from previous note.  ALLERGIES:  is allergic to no known allergies.  MEDICATIONS:  Current Outpatient Prescriptions  Medication Sig Dispense Refill  . anastrozole (ARIMIDEX) 1 MG tablet Take 1 mg by mouth daily.    . calcitonin, salmon, (MIACALCIN/FORTICAL) 200 UNIT/ACT nasal spray Place 1 spray into alternate nostrils daily.     . Menthol (ICY HOT) 5 % PTCH Apply 1 each topically. Apply to lower back QAM and remove QHS.  Monitor skin for rash/irritation    . ondansetron (ZOFRAN) 4 MG tablet Take 4-8 mg by mouth every 8 (eight) hours as needed for nausea or vomiting.    . polyethylene glycol (MIRALAX / GLYCOLAX) packet Take 17 g by mouth daily.    Marland Kitchen senna-docusate (SENOKOT-S) 8.6-50 MG tablet Take 1 tablet by mouth 2 (two) times daily.    . traMADol (ULTRAM) 50 MG tablet Take 100 mg by mouth daily. 9PM, hold  for sedation    . UNABLE TO FIND Med Name: Med pass 120 mL TID between meals    . vitamin B-12 (CYANOCOBALAMIN) 100 MCG tablet Take 100 mcg by mouth daily.    . vitamin C (ASCORBIC ACID) 500 MG tablet Take 500 mg by mouth daily.    . Vitamin D, Ergocalciferol, (DRISDOL) 50000 units CAPS capsule Take 50,000 Units by mouth every 7 (seven) days.     No current facility-administered medications for this visit.     PHYSICAL EXAMINATION: ECOG PERFORMANCE STATUS: 3  Vitals:   12/27/16 1413  BP: (!) 161/77  Pulse: 91  Resp: 16  Temp: 97.7 F (36.5 C)   Filed Weights   12/27/16 1413  Weight: 89 lb 9.6 oz (40.6 kg)     GENERAL:alert, no distress and comfortable. In  wheelchair SKIN: skin color, texture, turgor are normal, no rashes or significant lesions EYES: normal, Conjunctiva are pink and non-injected, sclera clear OROPHARYNX:no exudate, no erythema and lips, buccal mucosa, and tongue normal  NECK: supple, thyroid normal size, non-tender, without nodularity LYMPH:  no palpable lymphadenopathy in the cervical, axillary or inguinal LUNGS: clear to auscultation and percussion with normal breathing effort HEART: regular rate & rhythm and no murmurs and no lower extremity edema ABDOMEN:abdomen soft, non-tender and normal bowel sounds Musculoskeletal:no cyanosis of digits and no clubbing  NEURO: alert & oriented x 3 with fluent speech, no focal motor/sensory deficits Breasts: Left breast mastectomy site is well healed. Left axillary region is clear of abnormality. Right breast and right axilla are without abnormality. No palpable mass or lymph node  LABORATORY DATA:  I have reviewed the data as listed CBC Latest Ref Rng & Units 12/27/2016 08/24/2016 08/16/2016  WBC 3.9 - 10.3 10e3/uL 7.3 7.7 7.7  Hemoglobin 11.6 - 15.9 g/dL 14.0 13.8 12.9  Hematocrit 34.8 - 46.6 % 41.9 42.7 40  Platelets 145 - 400 10e3/uL 154 214 218     CMP Latest Ref Rng & Units 12/27/2016 08/24/2016 08/16/2016  Glucose  70 - 140 mg/dl 160(H) 157(H) -  BUN 7.0 - 26.0 mg/dL 15.5 20.9 20  Creatinine 0.6 - 1.1 mg/dL 0.7 0.7 0.5  Sodium 136 - 145 mEq/L 146(H) 141 142  Potassium 3.5 - 5.1 mEq/L 3.5 4.5 4.0  Chloride 101 - 111 mmol/L - - -  CO2 22 - 29 mEq/L 28 29 -  Calcium 8.4 - 10.4 mg/dL 9.8 9.8 -  Total Protein 6.4 - 8.3 g/dL 6.8 7.1 -  Total Bilirubin 0.20 - 1.20 mg/dL 0.66 0.53 -  Alkaline Phos 40 - 150 U/L 79 79 71  AST 5 - 34 U/L 16 15 16   ALT 0 - 55 U/L 13 12 9    PATHOLOGY REPORT  Diagnosis 12/05/2015 Breast, left, needle core biopsy, 10:00 o'clock - INVASIVE MAMMARY CARCINOMA. - SEE COMMENT. Microscopic Comment The carcinoma appears grade 1-2. An E-Cadherin stain will be performed and the results reported separately. A breast prognostic profile will be performed and the results reported separately. The results were called to [facility] on 12/08/15. (JBK:ds 12/08/15) By immunohistochemistry, the malignant cells are negative for E-Cadherin, supporting a lobular phenotype. (JBK:ds 12/08/15)  Results: IMMUNOHISTOCHEMICAL AND MORPHOMETRIC ANALYSIS PERFORMED MANUALLY Estrogen Receptor: 95%, POSITIVE, STRONG STAINING INTENSITY Progesterone Receptor: 95%, POSITIVE, STRONG STAINING INTENSITY Proliferation Marker Ki67: 12% FLUORESCENCE IN-SITU HYBRIDIZATION Results: HER2 - NEGATIVE RATIO OF HER2/CEP17 SIGNALS 1.27 AVERAGE HER2 COPY NUMBER PER CELL 2.80  Diagnosis 01/21/2016 Breast, simple mastectomy, Left - INVASIVE LOBULAR CARCINOMA, GRADE II/III, AT LEAST TWO FOCI, THE LARGER SPANS 5.0 CM. - LYMPHOVASCULAR INVASION IS IDENTIFIED. - CARCINOMA INVOLVES THE NIPPLE AND SKIN WITH ULCERATION. - INVASIVE CARCINOMA IS BROADLY LESS THAN 0.1 CM TO THE POSTERIOR MARGIN. - METASTATIC CARCINOMA IN 1 OF 2 LYMPH NODES (1/2). - SEE ONCOLOGY TABLE BELOW. Microscopic Comment BREAST, INVASIVE TUMOR, WITHOUT LYMPH NODES PRESENT Specimen, including laterality : Left breast Procedure: Simple  mastectomy Histologic type: Lobular Grade: II Tubule formation: 3 Nuclear pleomorphism: 2 Mitotic: 1 Tumor size (gross measurement): 5.0 cm Margins: Invasive, distance to closest margin: Broadly less than 0.1 cm to the posterior margin Lymphovascular invasion: Present Ductal carcinoma in situ: Not identified Tumor focality: At least two foci Treatment effect: N/A Extent of tumor: Skin: Involved, with ulceration Nipple: Involved Skeletal muscle: Not identified. Breast prognostic profile: [phone]  Estrogen receptor: 95% 1 of 3 FINAL for GWENDOLYN, MCLEES ([id number]) Microscopic Comment(continued) Progesterone receptor: 95% Her 2 neu: No amplification was detected. Ki-67: 12% Non-neoplastic breast: Significant vascular calcifications. TNM: pT4b, pN1a Comments: In addition to the main 5.0 cm tumor, there is similar appearing invasive lobular carcinoma present in random tissue submitted from the inner quadrant of the specimen. An immunohistochemical stain for cytokeratin was used to help evaluate this case. (JBK:kh 01-26-16)  RADIOGRAPHIC STUDIES: I have personally reviewed the radiological images as listed and agreed with the findings in the report.  Mammogram screening 12/07/16 IMPRESSION: No mammographic evidence of malignancy. A result letter of this screening mammogram will be mailed directly to the patient.  Diagnostic mammogram and ultrasound bilateral breast 12/05/2015 IMPRESSION: Irregular fungating mass within the retroareolar left breast, extending to the upper inner quadrant of the left breast, measuring at least 5 cm greatest dimension based on the mammogram, with obvious skin involvement. Ultrasound-guided biopsy is recommended.   ASSESSMENT & PLAN:  83 y.o. Caucasian female, with past medical history of arthritis, fall, wheelchair bound, presented with a large left breast mass.  1. Central portion of left female breast, invasive lobular carcinoma,  pT4N1aM0, stage IIIB, ER+/PR+/HER2- -I previously discussed lab findings with patient and her son Dellis Filbert -Her breast cancer was complete resected, with close margin. 2 lymph nodes were positive. She had stage III disease. -due to her advanced age, adjuvant radiation was not offered. -She is on adjuvant anastrozole, tolerating well, will continue for 5-10 years to reduce her risk of recurrence. -She is clinically doing well, no new pain or other symptoms, exam was unremarkable, lab results reviewed with her, no clinical concern for recurrence or metastasis. -We again discussed breast cancer surveillance after her surgery, if she is able to, we would continue annual screening mammogram of her right breast, and follow up with [place] regularly for lab and exam.  -Labs reviewed and adequate to continue anastrozole.So we will f/u in 6 months since it has been a year since diagnosis.  -I strongly encouraged her to tell someone so she can call us if she feels any lumps or pain in her breast.    2. Arthritis and history of fall -She uses a wheelchair -Tylenol and tramadol as needed for pain control  3. Weight loss and malnutrition -I previously encouraged her to take nutrition supplement.  Plan -labs and f/u in 6 months  -She will continue anastrozole 1 mg daily  -discussed with her son    All questions were answered. The patient knows to call the clinic with any problems, questions or concerns. No barriers to learning was detected.  I spent 15 minutes counseling the patient face to face. The total time spent in the appointment was 20 minutes and more than 50% was on counseling and review of test results  This document serves as a record of services personally performed by Truitt Merle, MD. It was created on her behalf by Joslyn Devon, a trained medical scribe. The creation of this record is based on the scribe's personal observations and the provider's statements to them. This document has been checked  and approved by the attending provider.   I have reviewed the above documentation for accuracy and completeness and I agree with the above.   Truitt Merle, MD 12/27/2016

## 2017-01-28 DIAGNOSIS — M810 Age-related osteoporosis without current pathological fracture: Secondary | ICD-10-CM | POA: Diagnosis not present

## 2017-01-28 DIAGNOSIS — I10 Essential (primary) hypertension: Secondary | ICD-10-CM | POA: Diagnosis not present

## 2017-01-28 DIAGNOSIS — Z0000 Encounter for general adult medical examination without abnormal findings: Secondary | ICD-10-CM | POA: Diagnosis not present

## 2017-02-04 DIAGNOSIS — R2681 Unsteadiness on feet: Secondary | ICD-10-CM | POA: Diagnosis not present

## 2017-02-04 DIAGNOSIS — M545 Low back pain: Secondary | ICD-10-CM | POA: Diagnosis not present

## 2017-02-04 DIAGNOSIS — M6281 Muscle weakness (generalized): Secondary | ICD-10-CM | POA: Diagnosis not present

## 2017-02-17 DIAGNOSIS — M549 Dorsalgia, unspecified: Secondary | ICD-10-CM | POA: Diagnosis not present

## 2017-02-26 DIAGNOSIS — M6281 Muscle weakness (generalized): Secondary | ICD-10-CM | POA: Diagnosis not present

## 2017-02-26 DIAGNOSIS — R2681 Unsteadiness on feet: Secondary | ICD-10-CM | POA: Diagnosis not present

## 2017-02-26 DIAGNOSIS — M545 Low back pain: Secondary | ICD-10-CM | POA: Diagnosis not present

## 2017-03-29 DIAGNOSIS — M6281 Muscle weakness (generalized): Secondary | ICD-10-CM | POA: Diagnosis not present

## 2017-03-29 DIAGNOSIS — R278 Other lack of coordination: Secondary | ICD-10-CM | POA: Diagnosis not present

## 2017-04-13 DIAGNOSIS — M810 Age-related osteoporosis without current pathological fracture: Secondary | ICD-10-CM | POA: Diagnosis not present

## 2017-04-13 DIAGNOSIS — E559 Vitamin D deficiency, unspecified: Secondary | ICD-10-CM | POA: Diagnosis not present

## 2017-04-13 DIAGNOSIS — Z853 Personal history of malignant neoplasm of breast: Secondary | ICD-10-CM | POA: Diagnosis not present

## 2017-04-13 DIAGNOSIS — I10 Essential (primary) hypertension: Secondary | ICD-10-CM | POA: Diagnosis not present

## 2017-04-14 DIAGNOSIS — R627 Adult failure to thrive: Secondary | ICD-10-CM | POA: Diagnosis not present

## 2017-04-28 DEATH — deceased

## 2017-06-23 ENCOUNTER — Telehealth: Payer: Self-pay | Admitting: Hematology

## 2017-06-23 NOTE — Telephone Encounter (Signed)
Call day - moved 1/2 appt to 1/8. Left message. Schedule mailed.

## 2017-06-29 ENCOUNTER — Other Ambulatory Visit: Payer: Medicare Other

## 2017-06-29 ENCOUNTER — Ambulatory Visit: Payer: Medicare Other | Admitting: Hematology

## 2017-07-04 ENCOUNTER — Telehealth: Payer: Self-pay | Admitting: Hematology

## 2017-07-04 NOTE — Telephone Encounter (Signed)
07/04/17 marked patient deceased per phone call from family member who was upset because they keep receiving appointment reminder calls for deceased patient.

## 2017-07-05 ENCOUNTER — Other Ambulatory Visit: Payer: Medicare Other

## 2017-07-05 ENCOUNTER — Ambulatory Visit: Payer: Medicare Other | Admitting: Hematology

## 2017-09-14 IMAGING — CR DG SACRUM/COCCYX 2+V
3 series · 3 of 3 positions shown · non-contrast
Comparison: Lumbar spine radiographs performed 03/14/2009

CLINICAL DATA: Status post fall, with sacral pain. Initial
encounter.

EXAM:
SACRUM AND COCCYX - 2+ VIEW

[t sacrum ap]
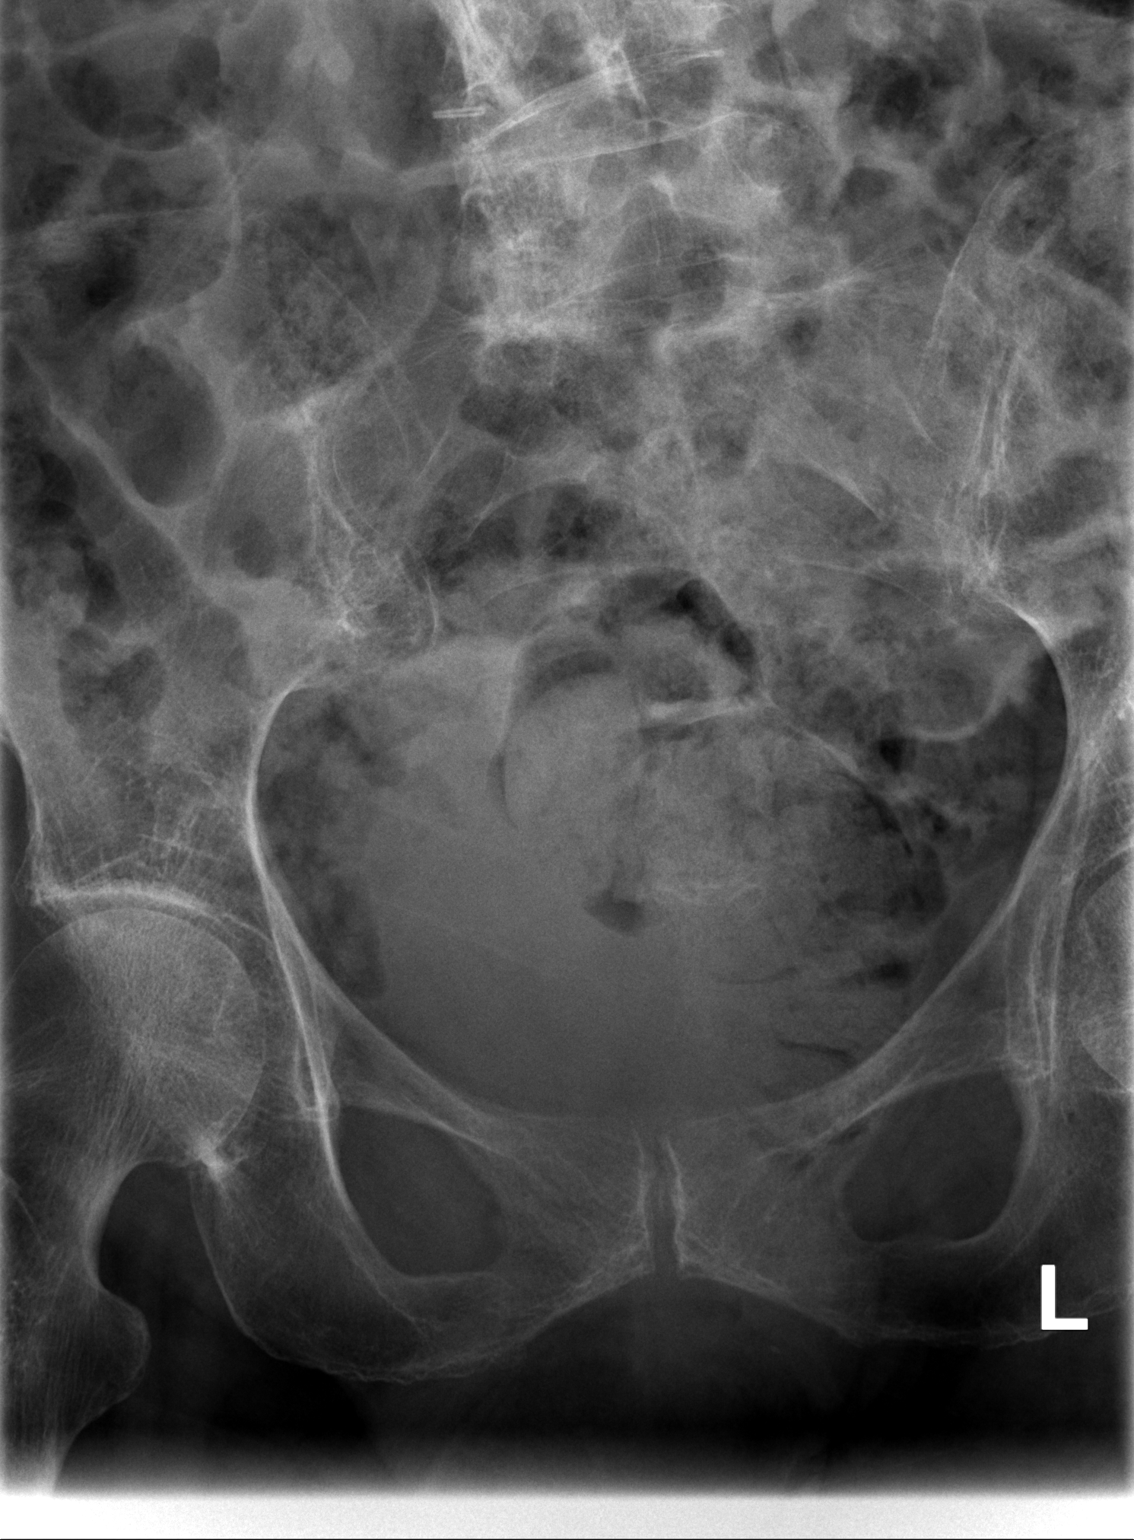

[t coccyx ap]
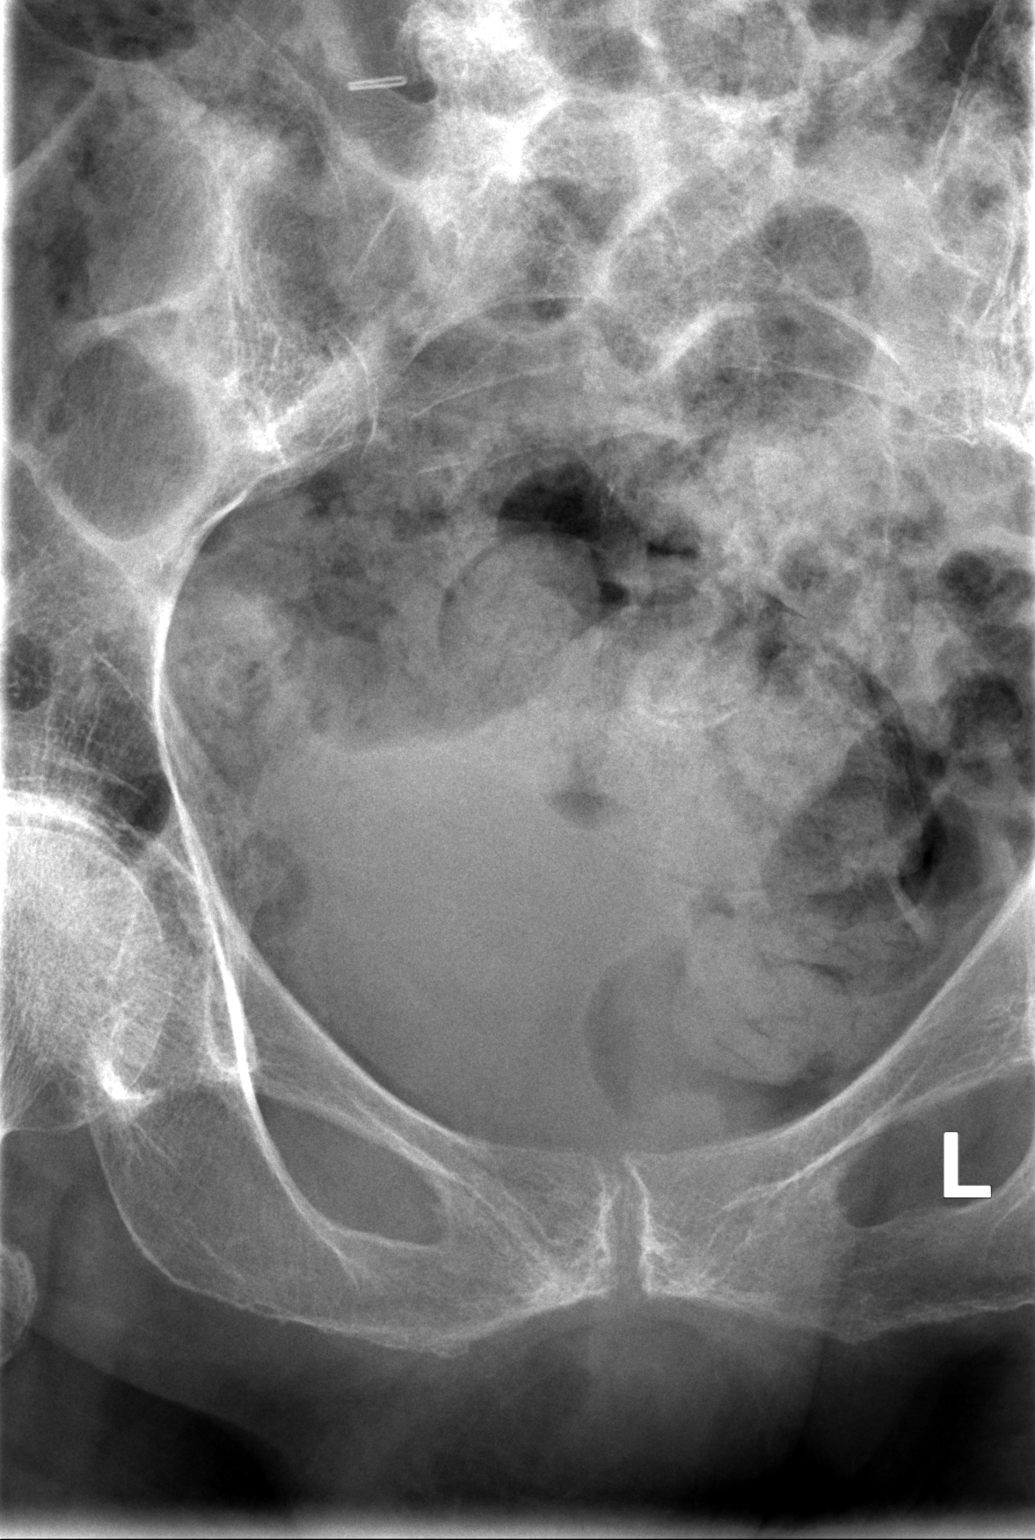

[t sacrum coccyx lat]
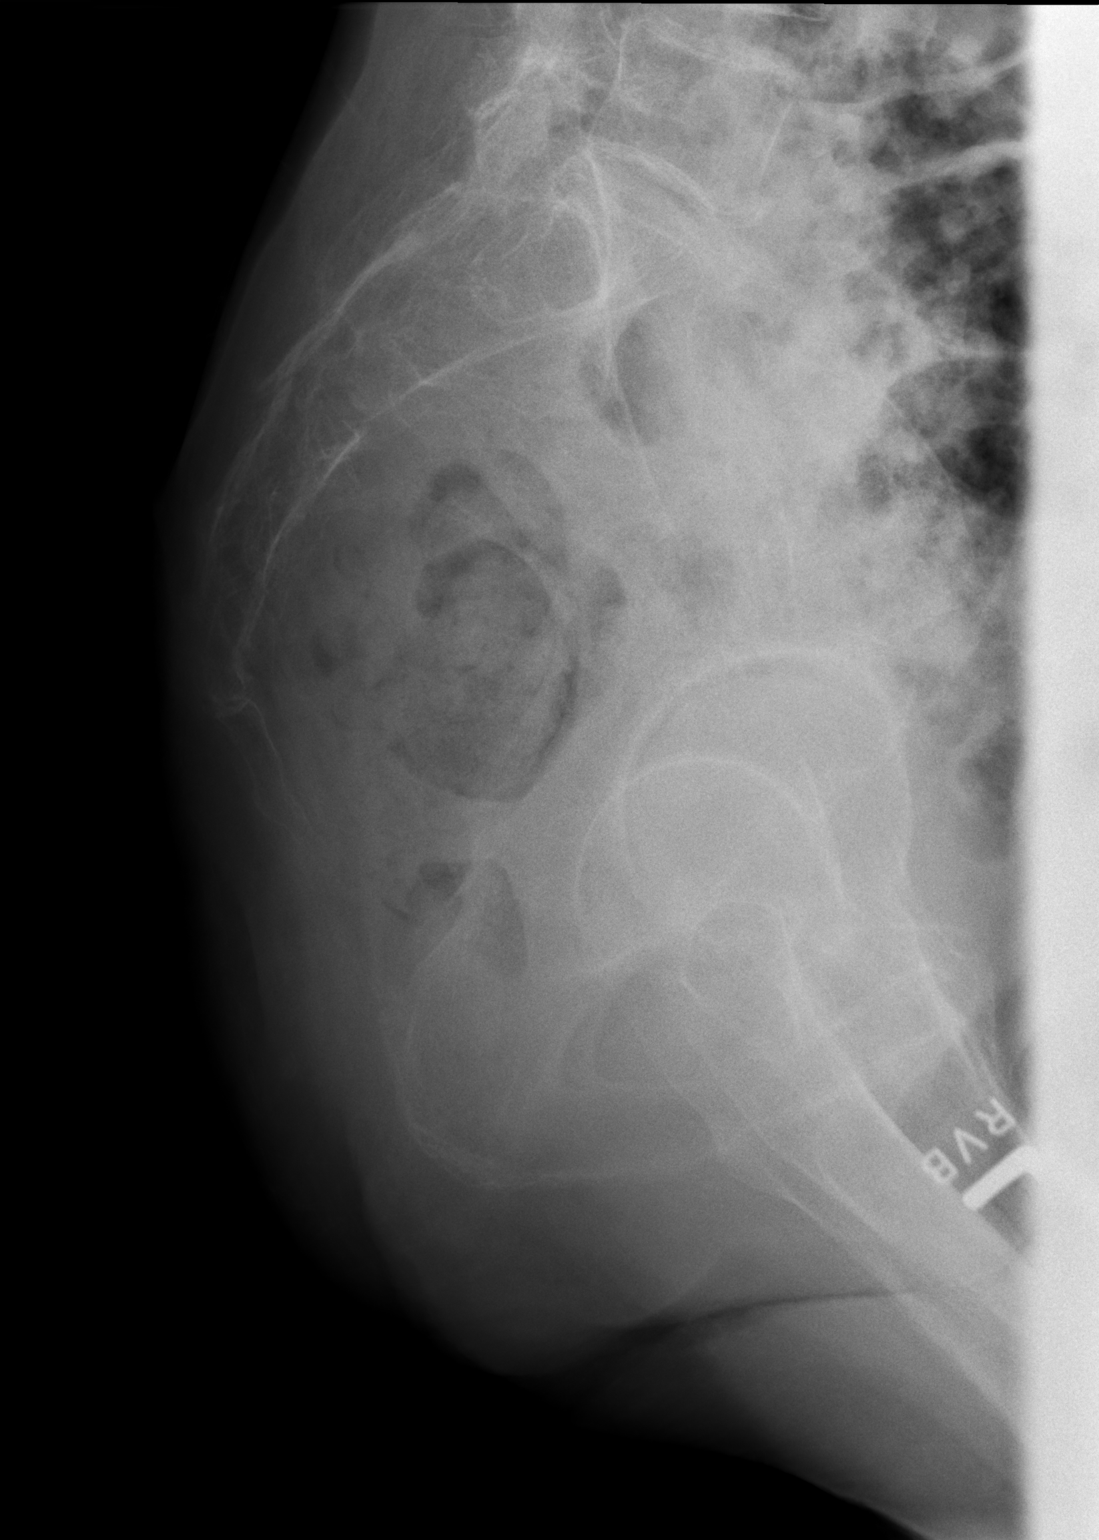

[3 of 3 positions shown; findings below may reference images not displayed]

FINDINGS: The sacrum and coccyx demonstrate grossly normal alignment, without
definite evidence of fracture. Mild degenerative change is noted at
the lower lumbar spine. The sacroiliac joints demonstrate mild
sclerosis. The visualized portions of the hips are grossly
unremarkable.
IMPRESSION: No evidence of fracture.

## 2017-09-14 IMAGING — CR DG LUMBAR SPINE COMPLETE 4+V
6 series · 6 of 6 positions shown · non-contrast
Comparison: Radiographs 01/20/2010

CLINICAL DATA: [AGE] female with lumbosacral back pain.

EXAM:
LUMBAR SPINE - COMPLETE 4+ VIEW

[t lumbar spine ap]
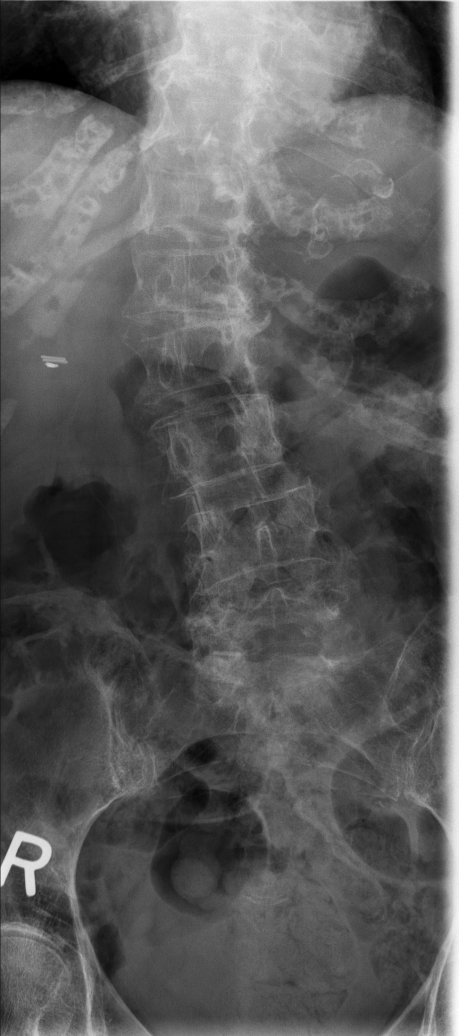

[t lumbar spine obl (1 of 3)]
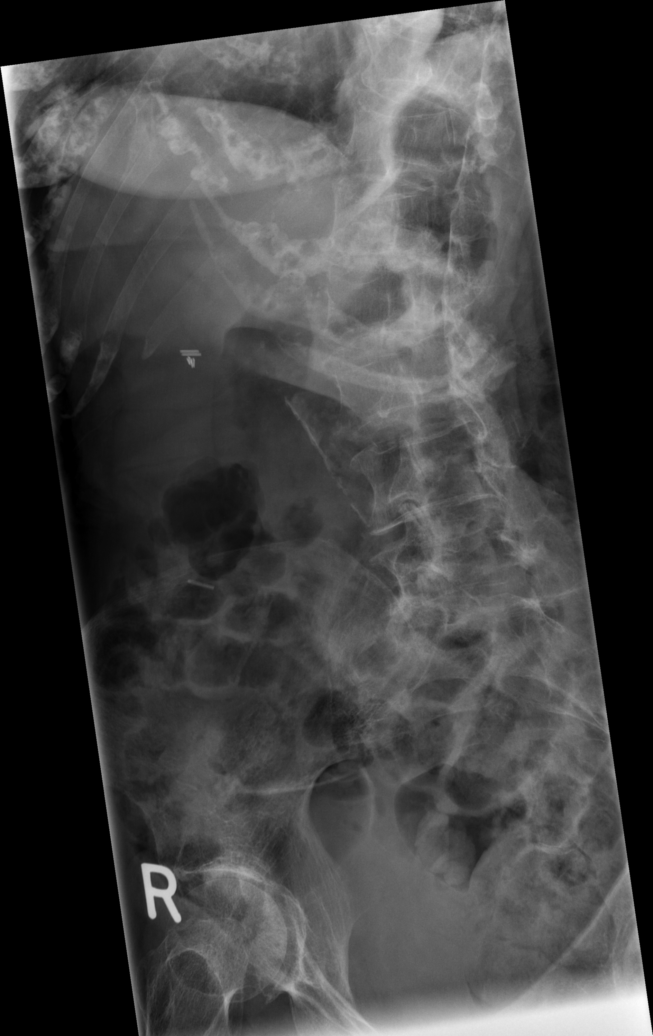

[t lumbar spine obl (2 of 3)]
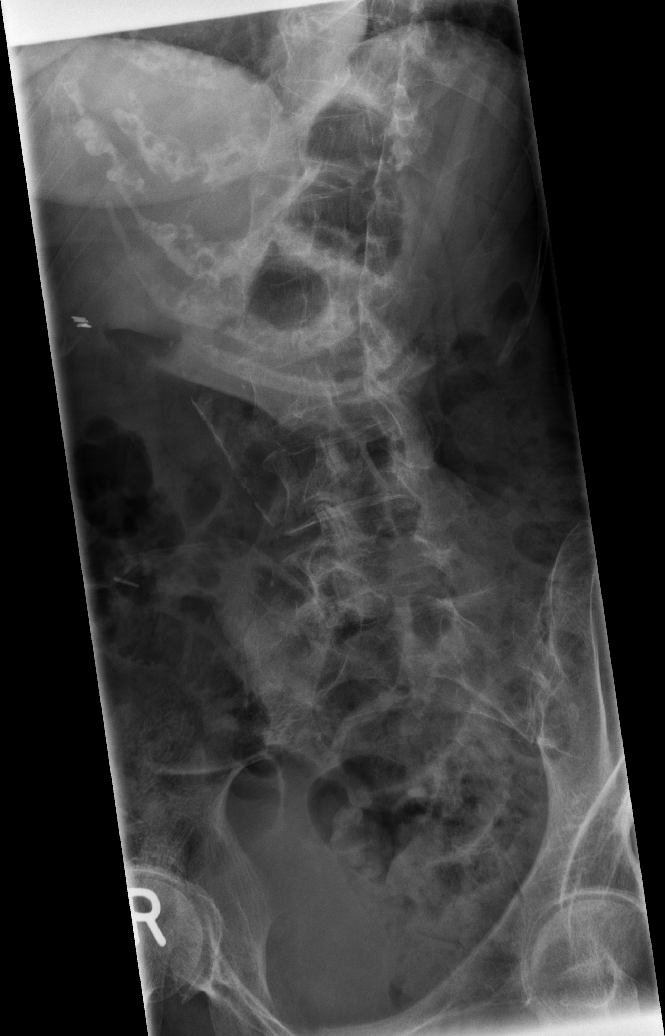

[t lumbar spine obl (3 of 3)]
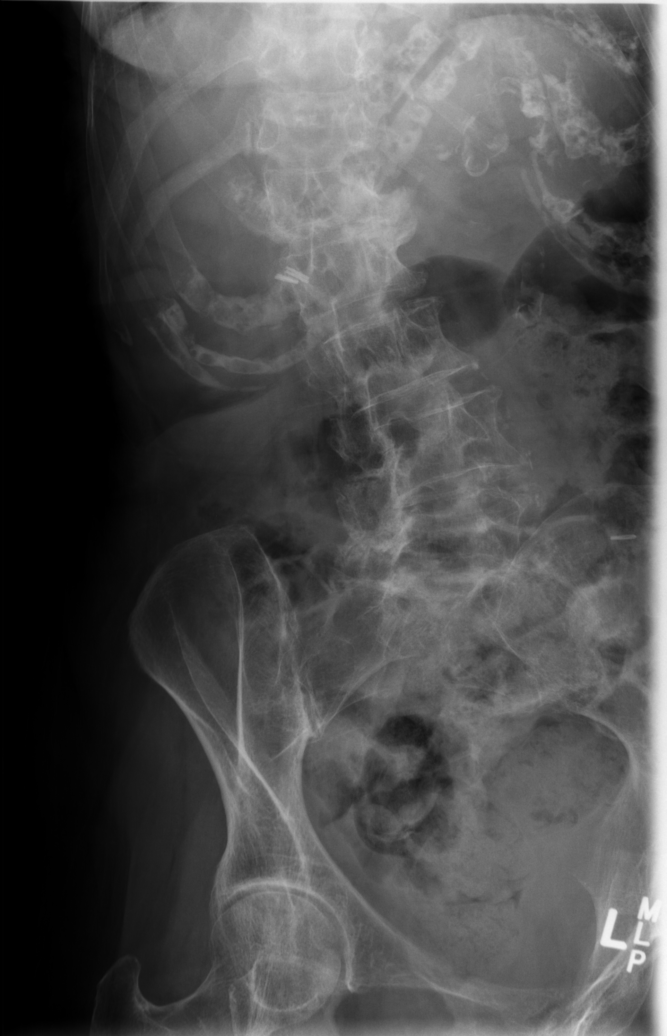

[t lumbar spine lat]
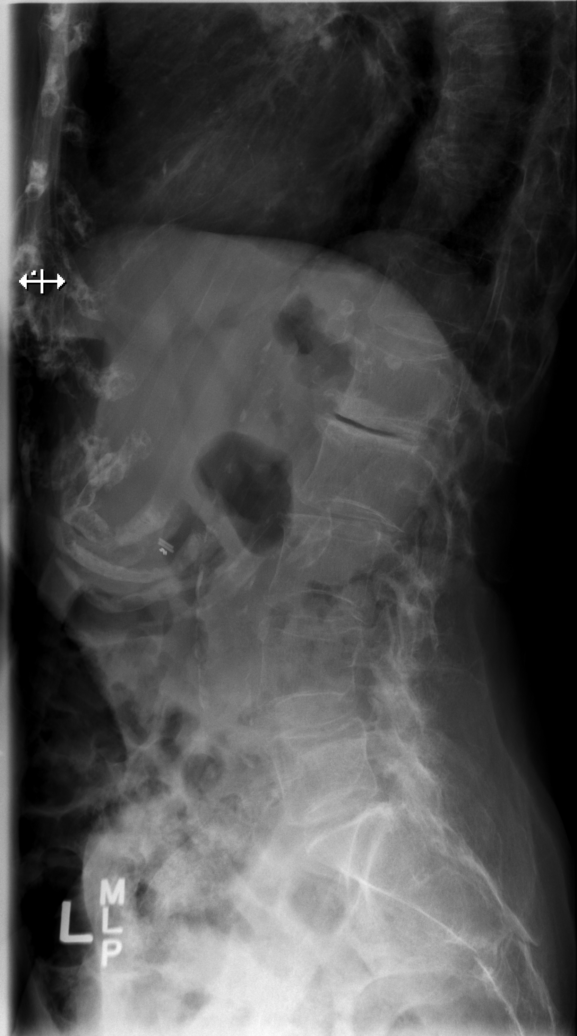

[t lumbar l-5 s-1 spot]
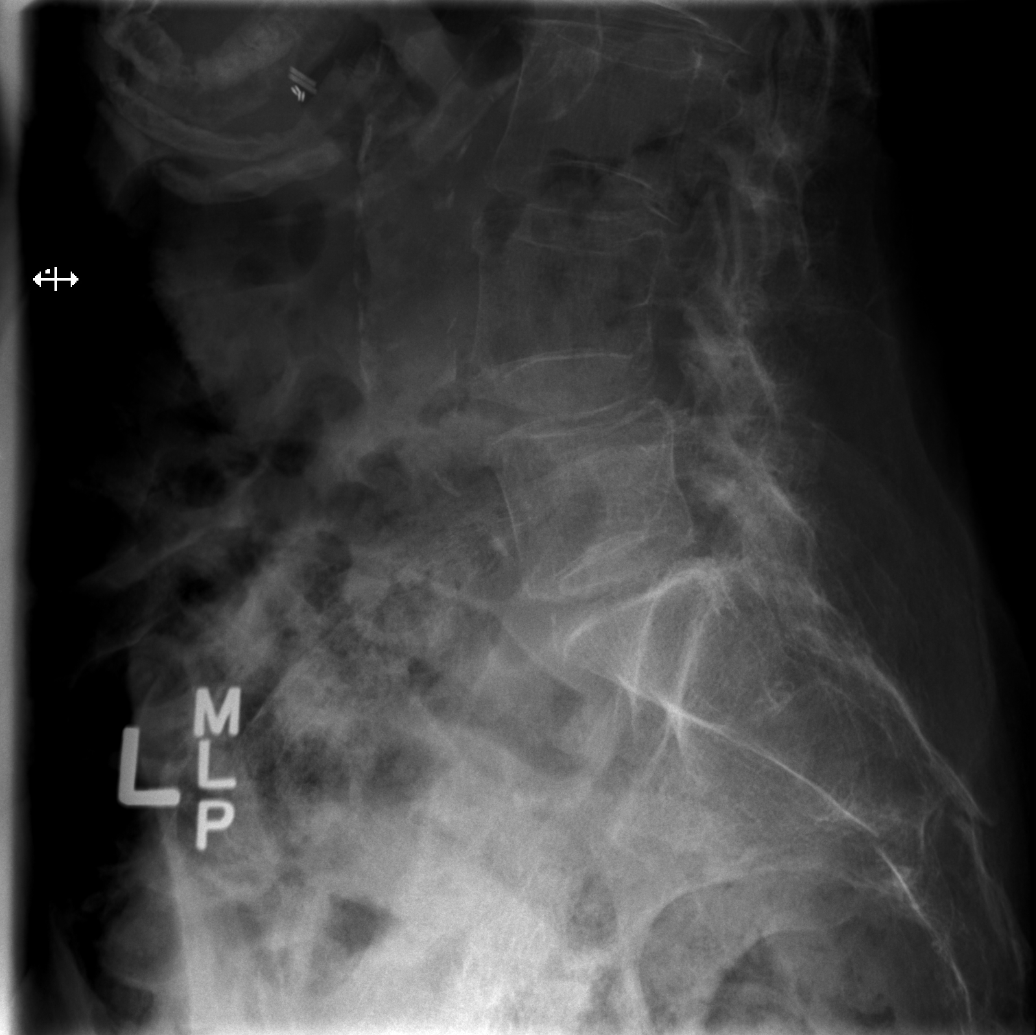

[6 of 6 positions shown; findings below may reference images not displayed]

FINDINGS: Increased dextroscoliotic curvature of the lumbar spine. Progressive
disc space narrowing and endplate spurring throughout most prominent
at L1-L2. Progressive facet arthropathy at L4-L5 and L5-S1.
Questionable superior endplate compression deformity of L4 versus
positioning. Vertebral body heights are otherwise maintained. The
bones are under mineralized.
IMPRESSION: 1. Questionable mild superior endplate compression deformity of L4,
age-indeterminate, versus positioning.
2. Progressive scoliosis, degenerative disc disease, and facet
arthropathy since exam 6 years prior.
